# Patient Record
Sex: Female | Born: 1935 | Race: Black or African American | Hispanic: No | Marital: Married
Health system: Southern US, Community
[De-identification: ages and names within clinical notes are randomized; demographics above are authoritative.]

---

## 2013-12-26 DEATH — deceased

## 2021-10-05 ENCOUNTER — Emergency Department (HOSPITAL_COMMUNITY): Payer: Medicare Other

## 2021-10-05 ENCOUNTER — Emergency Department (HOSPITAL_COMMUNITY)
Admission: EM | Admit: 2021-10-05 | Discharge: 2021-10-06 | Disposition: A | Discharge status: Home / Self Care | Payer: Medicare Other | Attending: Emergency Medicine | Admitting: Emergency Medicine

## 2021-10-05 DIAGNOSIS — R001 Bradycardia, unspecified: Secondary | ICD-10-CM | POA: Diagnosis not present

## 2021-10-05 DIAGNOSIS — R55 Syncope and collapse: Secondary | ICD-10-CM | POA: Diagnosis not present

## 2021-10-05 DIAGNOSIS — I10 Essential (primary) hypertension: Secondary | ICD-10-CM | POA: Diagnosis not present

## 2021-10-05 LAB — CBC WITH DIFFERENTIAL/PLATELET
Abs Immature Granulocytes: 0 10*3/uL (ref 0.00–0.07)
Basophils Absolute: 0 10*3/uL (ref 0.0–0.1)
Basophils Relative: 0 %
Eosinophils Absolute: 0.1 10*3/uL (ref 0.0–0.5)
Eosinophils Relative: 2 %
HCT: 38.3 % (ref 36.0–46.0)
Hemoglobin: 12.4 g/dL (ref 12.0–15.0)
Immature Granulocytes: 0 %
Lymphocytes Relative: 23 %
Lymphs Abs: 1 10*3/uL (ref 0.7–4.0)
MCH: 27.7 pg (ref 26.0–34.0)
MCHC: 32.4 g/dL (ref 30.0–36.0)
MCV: 85.7 fL (ref 80.0–100.0)
Monocytes Absolute: 0.3 10*3/uL (ref 0.1–1.0)
Monocytes Relative: 8 %
Neutro Abs: 3.1 10*3/uL (ref 1.7–7.7)
Neutrophils Relative %: 67 %
Platelets: 153 10*3/uL (ref 150–400)
RBC: 4.47 MIL/uL (ref 3.87–5.11)
RDW: 14.9 % (ref 11.5–15.5)
WBC: 4.5 10*3/uL (ref 4.0–10.5)
nRBC: 0 % (ref 0.0–0.2)

## 2021-10-05 MED ORDER — SODIUM CHLORIDE 0.9 % IV BOLUS
500.0000 mL | Freq: Once | INTRAVENOUS | Status: AC
  Administered 2021-10-05: 500 mL via INTRAVENOUS

## 2021-10-05 NOTE — ED Triage Notes (Addendum)
Pt arrived via [facility] for near syncope while eating dinner, pt reported feeling "woozy", no cpp, sob, n/v, diaphoresis. 2nd EMS call in 24 hours for similar cc. EMS report 40 point drop in orthostatics on scene and episodes of bradycardia, lowest 40bpm with frequent PVCs.  GCS 15, A&Ox4.  20g LA, administered approx NS.   Vitals HR 40-60bpm BP 190/90 SPO2 98% CBG 108

## 2021-10-05 NOTE — ED Provider Notes (Signed)
[facility] EMERGENCY DEPARTMENT Provider Note   CSN: [id number] Arrival date & time: 10/05/21  2150     History Chief Complaint  Patient presents with   Near Syncope    Debra Duran is a 85 y.o. female.  The history is provided by the patient and medical records.  Near Syncope  85 y.o. F with hx of HTN, presenting to the ED for near syncope at home tonight while having dinner.  Husband reports history of same in the past related to dehydration.  Husband states she adamantly refuses to drink water during the day-- drinks maybe 1 bottle of water per day.  Last episode of this was in December 2021.  She has recently had her home medications adjusted with cardiology office due to BP/pulse dropping to low.  She denies any active chest pain, SOB.  No recent fever, illness, vomiting, or diarrhea.  Patient with positive orthostatics with EMS (dropped approx 40 points).  Patient from [place], here visiting family.  No past medical history on file.  There are no problems to display for this patient.   OB History   No obstetric history on file.     No family history on file.     Home Medications Prior to Admission medications   Not on File    Allergies    Patient has no allergy information on record.  Review of Systems   Review of Systems  Cardiovascular:  Positive for near-syncope.  All other systems reviewed and are negative.  Physical Exam Updated Vital Signs BP (!) 167/84   Pulse (!) 53   Temp 98.9 F (37.2 C) (Oral)   Resp 15   Ht 5\' 6"  (1.676 m)   Wt 61.2 kg   SpO2 100%   BMI 21.79 kg/m   Physical Exam Vitals and nursing note reviewed.  Constitutional:      Appearance: She is well-developed.  HENT:     Head: Normocephalic and atraumatic.     Mouth/Throat:     Comments: Dry mucous membranes Eyes:     Conjunctiva/sclera: Conjunctivae normal.     Pupils: Pupils are equal, round, and reactive to light.  Cardiovascular:      Rate and Rhythm: Normal rate and regular rhythm.     Heart sounds: Normal heart sounds.  Pulmonary:     Effort: Pulmonary effort is normal. No respiratory distress.     Breath sounds: Normal breath sounds. No rhonchi.  Abdominal:     General: Bowel sounds are normal.     Palpations: Abdomen is soft.     Tenderness: There is no abdominal tenderness. There is no rebound.  Musculoskeletal:        General: Normal range of motion.     Cervical back: Normal range of motion.  Skin:    General: Skin is warm and dry.  Neurological:     Mental Status: She is alert and oriented to person, place, and time.     Comments: Awake, alert, oriented, moving extremities well without focal deficits    ED Results / Procedures / Treatments   Labs (all labs ordered are listed, but only abnormal results are displayed) Labs Reviewed  BASIC METABOLIC PANEL - Abnormal; Notable for the following components:      Result Value   Potassium 3.3 (*)    Glucose, Bld 101 (*)    Creatinine, Ser 1.48 (*)    GFR, Estimated 35 (*)    All other components within normal limits  URINALYSIS, ROUTINE W REFLEX MICROSCOPIC - Abnormal; Notable for the following components:   Color, Urine STRAW (*)    Hgb urine dipstick MODERATE (*)    RBC / HPF >50 (*)    All other components within normal limits  CBC WITH DIFFERENTIAL/PLATELET  TROPONIN I (HIGH SENSITIVITY)    EKG EKG Interpretation  Date/Time:  Tuesday October 05 2021 21:56:35 EDT Ventricular Rate:  54 PR Interval:  204 QRS Duration: 127 QT Interval:  435 QTC Calculation: 413 R Axis:   -6 Text Interpretation: Sinus bradycardia Left ventricular hypertrophy Inferior infarct, old No old tracing to compare Confirmed by Pricilla Loveless [phone]) on 10/05/2021 10:02:23 PM  Radiology DG Chest 2 View  Result Date: 10/05/2021 CLINICAL DATA:  Syncope,  orthostasis EXAM: CHEST - 2 VIEW COMPARISON:  None. FINDINGS: The heart and mediastinal contours are within normal  limits. Aortic calcification. Cardiac surgical changes overlie the mediastinum. No focal consolidation. No pulmonary edema. No pleural effusion. No pneumothorax. No acute osseous abnormality.  Intact sternotomy wires. IMPRESSION: No active cardiopulmonary disease. Electronically Signed   By: Tish Frederickson M.D.   On: 10/05/2021 22:50    Procedures Procedures   Medications Ordered in ED Medications  sodium chloride 0.9 % bolus 500 mL (0 mLs Intravenous Stopped 10/06/21 0010)    ED Course  I have reviewed the triage vital signs and the nursing notes.  Pertinent labs & imaging results that were available during my care of the patient were reviewed by me and considered in my medical decision making (see chart for details).    MDM Rules/Calculators/A&P                           85 y.o. F here with near syncopal event.  There was no head injury or LOC.  Hx of same related to poor oral intake.  Medications also recently adjusted by her cardioligist in [place] due to bradycardia and hypotension.  Was found to be orthostatic with EMS.  Here she is AAOx3.  Openly admits she does not drink water but has been eating regularly.  No recent illness, fever, urinary symptoms.  EKG non-ischemic.  Labs reassuring.  CXR clear.  Patient states she feels better after IVF and wants to go home.  Feel this is reasonable as she has a history of same with explainable cause and appropriate follow-up once she returns home.  Given copies of labs and imaging studies for physician review.  Encouraged to push oral times.  Return here for new concerns.  Final Clinical Impression(s) / ED Diagnoses Final diagnoses:  Near syncope    Rx / DC Orders ED Discharge Orders     None        Garlon Hatchet, PA-C 10/06/21 [id number]    Pricilla Loveless, MD 10/08/21 1630

## 2021-10-06 LAB — URINALYSIS, ROUTINE W REFLEX MICROSCOPIC
Bacteria, UA: NONE SEEN
Bilirubin Urine: NEGATIVE
Glucose, UA: NEGATIVE mg/dL
Ketones, ur: NEGATIVE mg/dL
Leukocytes,Ua: NEGATIVE
Nitrite: NEGATIVE
Protein, ur: NEGATIVE mg/dL
RBC / HPF: >50 RBC/hpf — ABNORMAL HIGH (ref 0–5)
Specific Gravity, Urine: 1.009 (ref 1.005–1.030)
pH: 7 (ref 5.0–8.0)

## 2021-10-06 LAB — BASIC METABOLIC PANEL
Anion gap: 12 (ref 5–15)
BUN: 21 mg/dL (ref 8–23)
CO2: 28 mmol/L (ref 22–32)
Calcium: 9.4 mg/dL (ref 8.9–10.3)
Chloride: 98 mmol/L (ref 98–111)
Creatinine, Ser: 1.48 mg/dL — ABNORMAL HIGH (ref 0.44–1.00)
GFR, Estimated: 35 mL/min — ABNORMAL LOW (ref >60)
Glucose, Bld: 101 mg/dL — ABNORMAL HIGH (ref 70–99)
Potassium: 3.3 mmol/L — ABNORMAL LOW (ref 3.5–5.1)
Sodium: 138 mmol/L (ref 135–145)

## 2021-10-06 LAB — TROPONIN I (HIGH SENSITIVITY): Troponin I (High Sensitivity): 6 ng/L (ref <18)

## 2021-10-06 NOTE — Discharge Instructions (Signed)
Please increase your water intake. Follow-up with your doctor once you get home.  Copies of labs and imaging on back. Return here for new concerns.

## 2021-12-18 ENCOUNTER — Inpatient Hospital Stay (HOSPITAL_COMMUNITY)
Admission: EM | Admit: 2021-12-18 | Discharge: 2021-12-20 | DRG: 312 | Disposition: A | Discharge status: Home / Self Care | Payer: Medicare Other | Attending: Internal Medicine | Admitting: Internal Medicine

## 2021-12-18 ENCOUNTER — Emergency Department (HOSPITAL_COMMUNITY): Payer: Medicare Other

## 2021-12-18 ENCOUNTER — Other Ambulatory Visit: Payer: Self-pay

## 2021-12-18 DIAGNOSIS — I10 Essential (primary) hypertension: Secondary | ICD-10-CM

## 2021-12-18 DIAGNOSIS — Z7902 Long term (current) use of antithrombotics/antiplatelets: Secondary | ICD-10-CM

## 2021-12-18 DIAGNOSIS — R55 Syncope and collapse: Secondary | ICD-10-CM | POA: Diagnosis not present

## 2021-12-18 DIAGNOSIS — I951 Orthostatic hypotension: Secondary | ICD-10-CM | POA: Diagnosis not present

## 2021-12-18 DIAGNOSIS — Z955 Presence of coronary angioplasty implant and graft: Secondary | ICD-10-CM

## 2021-12-18 DIAGNOSIS — Z880 Allergy status to penicillin: Secondary | ICD-10-CM

## 2021-12-18 DIAGNOSIS — Z951 Presence of aortocoronary bypass graft: Secondary | ICD-10-CM

## 2021-12-18 DIAGNOSIS — R001 Bradycardia, unspecified: Secondary | ICD-10-CM | POA: Diagnosis present

## 2021-12-18 DIAGNOSIS — E876 Hypokalemia: Secondary | ICD-10-CM | POA: Diagnosis present

## 2021-12-18 DIAGNOSIS — Z886 Allergy status to analgesic agent status: Secondary | ICD-10-CM

## 2021-12-18 DIAGNOSIS — Z20822 Contact with and (suspected) exposure to covid-19: Secondary | ICD-10-CM | POA: Diagnosis present

## 2021-12-18 DIAGNOSIS — E785 Hyperlipidemia, unspecified: Secondary | ICD-10-CM | POA: Diagnosis present

## 2021-12-18 DIAGNOSIS — I2510 Atherosclerotic heart disease of native coronary artery without angina pectoris: Secondary | ICD-10-CM

## 2021-12-18 LAB — TROPONIN I (HIGH SENSITIVITY): Troponin I (High Sensitivity): 5 ng/L (ref <18)

## 2021-12-18 LAB — CBC WITH DIFFERENTIAL/PLATELET
Abs Immature Granulocytes: 0.02 10*3/uL (ref 0.00–0.07)
Basophils Absolute: 0 10*3/uL (ref 0.0–0.1)
Basophils Relative: 0 %
Eosinophils Absolute: 0.1 10*3/uL (ref 0.0–0.5)
Eosinophils Relative: 1 %
HCT: 39.8 % (ref 36.0–46.0)
Hemoglobin: 13 g/dL (ref 12.0–15.0)
Immature Granulocytes: 0 %
Lymphocytes Relative: 21 %
Lymphs Abs: 1 10*3/uL (ref 0.7–4.0)
MCH: 27.5 pg (ref 26.0–34.0)
MCHC: 32.7 g/dL (ref 30.0–36.0)
MCV: 84.3 fL (ref 80.0–100.0)
Monocytes Absolute: 0.3 10*3/uL (ref 0.1–1.0)
Monocytes Relative: 7 %
Neutro Abs: 3.4 10*3/uL (ref 1.7–7.7)
Neutrophils Relative %: 71 %
Platelets: 182 10*3/uL (ref 150–400)
RBC: 4.72 MIL/uL (ref 3.87–5.11)
RDW: 15.5 % (ref 11.5–15.5)
WBC: 4.8 10*3/uL (ref 4.0–10.5)
nRBC: 0 % (ref 0.0–0.2)

## 2021-12-18 LAB — BASIC METABOLIC PANEL
Anion gap: 8 (ref 5–15)
BUN: 11 mg/dL (ref 8–23)
CO2: 31 mmol/L (ref 22–32)
Calcium: 9.5 mg/dL (ref 8.9–10.3)
Chloride: 97 mmol/L — ABNORMAL LOW (ref 98–111)
Creatinine, Ser: 1.3 mg/dL — ABNORMAL HIGH (ref 0.44–1.00)
GFR, Estimated: 41 mL/min — ABNORMAL LOW (ref >60)
Glucose, Bld: 135 mg/dL — ABNORMAL HIGH (ref 70–99)
Potassium: 3.3 mmol/L — ABNORMAL LOW (ref 3.5–5.1)
Sodium: 136 mmol/L (ref 135–145)

## 2021-12-18 MED ORDER — LACTATED RINGERS IV BOLUS
1000.0000 mL | Freq: Once | INTRAVENOUS | Status: AC
  Administered 2021-12-19: 01:00:00 1000 mL via INTRAVENOUS

## 2021-12-18 NOTE — ED Provider Notes (Signed)
Emergency Medicine Provider Triage Evaluation Note  Debra Duran , a 85 y.o. female  was evaluated in triage.  Pt complains of syncope.  Patient states that she was sitting at the dinner table tonight when she began to feel "woozy."  She states that her husband came around the table and helped her to the ground so she did not fall or hit her head.  Unknown whether she had full loss of consciousness as husband is not here to cooperate at this time.  Per EMS the patient had twelve-lead with questionable inferior ST depression.  Also noted the patient to be bradycardic.  The patient denies chest pain, lightheadedness or dizziness, palpitations, shortness of breath, lower extremity swelling, abdominal pain, nausea or diaphoresis at this time.  She denies any other symptoms during the syncopal episode.  She does have history of very similar presentation in October and was seen here in the emergency department.  Review of Systems  Positive: See above Negative:   Physical Exam  BP (!) 165/91 (BP Location: Left Arm)    Pulse 66    Temp 98.3 F (36.8 C) (Oral)    Resp 15    Ht 5\' 6"  (1.676 m)    Wt 61.2 kg    SpO2 97%    BMI 21.79 kg/m  Gen:   Awake, no distress   Resp:  Normal effort, lungs clear MSK:   Moves extremities without difficulty  Other:  S1/S2 without murmur.  Medical Decision Making  Medically screening exam initiated at 9:27 PM.  Appropriate orders placed.  Debra Duran was informed that the remainder of the evaluation will be completed by another provider, this initial triage assessment does not replace that evaluation, and the importance of remaining in the ED until their evaluation is complete.     Berniece Salines, PA-C 12/18/21 2130    2131, MD 12/19/21 [phone]

## 2021-12-18 NOTE — ED Provider Notes (Signed)
[facility] EMERGENCY DEPARTMENT Provider Note   CSN: [id number] Arrival date & time: 12/18/21  2101     History Chief Complaint  Patient presents with   Loss of Consciousness    Debra Duran is a 85 y.o. female.  Patient here by EMS with episode of syncope.  States she was sitting at the dinner table eating dinner when all of a sudden her family noticed that she was slumped over in her chair and had decreased responsiveness.  She was lowered to the ground by her family but did not fall or hit her head.  They states she was awake and responsive the whole time but somewhat slow to respond.  Patient does recall being moved to the floor by her family.  States while eating dinner she began to feel "woozy" but then later states that she did not know that this episode was coming.  She does have a history of previous episodes of syncope with work-up in [place] including echocardiogram by husband's report.  He states she has had frequent episodes of syncope in the past due to dehydration but he is placed emphasis on her drinking water and does not think that she is dehydrated currently.  She took her blood pressure medication is normal. She denies any room spinning dizziness.  She denies any chest pain or shortness of breath.  No abdominal pain.  No vomiting or diarrhea.  Denies hitting head. She does take plavix. EMS reported she has ST depressions inferiorly as well as bradycardia in the 50s.  She denies feeling dizzy currently. Patient had a similar episode in October at this facility that was attributed to dehydration  The history is provided by the patient and the spouse.  Loss of Consciousness Associated symptoms: dizziness   Associated symptoms: no chest pain, no fever, no headaches, no nausea, no shortness of breath and no vomiting       No past medical history on file.  There are no problems to display for this patient.   * The histories are not reviewed yet.  Please review them in the "History" navigator section and refresh this [place].   OB History   No obstetric history on file.     No family history on file.     Home Medications Prior to Admission medications   Not on File    Allergies    Aspirin and Penicillins  Review of Systems   Review of Systems  Constitutional:  Negative for activity change, appetite change and fever.  HENT:  Negative for congestion and rhinorrhea.   Eyes:  Negative for visual disturbance.  Respiratory:  Negative for cough, chest tightness and shortness of breath.   Cardiovascular:  Positive for syncope. Negative for chest pain and leg swelling.  Gastrointestinal:  Negative for abdominal pain, nausea and vomiting.  Genitourinary:  Negative for dysuria and hematuria.  Musculoskeletal:  Negative for arthralgias and myalgias.  Neurological:  Positive for dizziness, syncope and light-headedness. Negative for headaches.   all other systems are negative except as noted in the HPI and PMH.   Physical Exam Updated Vital Signs BP (!) 194/91    Pulse 60    Temp 98.3 F (36.8 C) (Oral)    Resp 18    Ht 5\' 6"  (1.676 m)    Wt 61.2 kg    SpO2 96%    BMI 21.79 kg/m   Physical Exam Vitals and nursing note reviewed.  Constitutional:      General: She  is not in acute distress.    Appearance: She is well-developed.  HENT:     Head: Normocephalic and atraumatic.     Mouth/Throat:     Pharynx: No oropharyngeal exudate.  Eyes:     Conjunctiva/sclera: Conjunctivae normal.     Pupils: Pupils are equal, round, and reactive to light.     Comments: Conjunctival hemorrhage on L  Neck:     Comments: No meningismus. Cardiovascular:     Rate and Rhythm: Normal rate and regular rhythm.     Heart sounds: Murmur heard.     Comments: Systolic murmur.  irregular PVCs on monitor Pulmonary:     Effort: Pulmonary effort is normal. No respiratory distress.     Breath sounds: Normal breath sounds.  Chest:     Chest wall:  No tenderness.  Abdominal:     Palpations: Abdomen is soft.     Tenderness: There is no abdominal tenderness. There is no guarding or rebound.  Musculoskeletal:        General: No tenderness. Normal range of motion.     Cervical back: Normal range of motion and neck supple.  Skin:    General: Skin is warm.  Neurological:     General: No focal deficit present.     Mental Status: She is alert and oriented to person, place, and time. Mental status is at baseline.     Cranial Nerves: No cranial nerve deficit.     Motor: No abnormal muscle tone.     Coordination: Coordination normal.     Comments: CN 2-12 intact, no ataxia on finger to nose, no nystagmus, 5/5 strength throughout, no pronator drift,    Psychiatric:        Behavior: Behavior normal.    ED Results / Procedures / Treatments   Labs (all labs ordered are listed, but only abnormal results are displayed) Labs Reviewed  BASIC METABOLIC PANEL - Abnormal; Notable for the following components:      Result Value   Potassium 3.3 (*)    Chloride 97 (*)    Glucose, Bld 135 (*)    Creatinine, Ser 1.30 (*)    GFR, Estimated 41 (*)    All other components within normal limits  RESP PANEL BY RT-PCR (FLU A&B, COVID) ARPGX2  CBC WITH DIFFERENTIAL/PLATELET  URINALYSIS, ROUTINE W REFLEX MICROSCOPIC  CBG MONITORING, ED  TROPONIN I (HIGH SENSITIVITY)  TROPONIN I (HIGH SENSITIVITY)    EKG EKG Interpretation  Date/Time:  Saturday December 18 2021 21:18:58 EST Ventricular Rate:  67 PR Interval:  188 QRS Duration: 110 QT Interval:  406 QTC Calculation: 429 R Axis:   -18 Text Interpretation: Normal sinus rhythm Moderate voltage criteria for LVH, may be normal variant ( R in aVL , Cornell product ) Inferior infarct , age undetermined Cannot rule out Anterior infarct , age undetermined Abnormal ECG No significant change was found Interpretation limited secondary to artifact Confirmed by Glynn Octave [phone]) on 12/18/2021 10:56:08  PM  Radiology DG Chest 2 View  Result Date: 12/18/2021 CLINICAL DATA:  Syncope EXAM: CHEST - 2 VIEW COMPARISON:  10/05/2021 FINDINGS: Post sternotomy changes. No focal opacity, pleural effusion or pneumothorax. Stable cardiomediastinal silhouette with aortic atherosclerosis. IMPRESSION: No active cardiopulmonary disease. Electronically Signed   By: Jasmine Pang M.D.   On: 12/18/2021 21:47   CT Head Wo Contrast  Result Date: 12/19/2021 CLINICAL DATA:  Syncope. EXAM: CT HEAD WITHOUT CONTRAST TECHNIQUE: Contiguous axial images were obtained from the base of the skull  through the vertex without intravenous contrast. COMPARISON:  None. FINDINGS: Brain: No acute intracranial hemorrhage, midline shift or mass effect. No extra-axial fluid collection. There is diffuse cerebral atrophy. Subcortical and periventricular white matter hypodensities are present bilaterally. No hydrocephalus. 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 There is a hypodensity in the basal ganglia on the left, possible lacunar infarct of indeterminate age. Vascular: Atherosclerotic calcification of the carotid siphons and vertebral arteries. No hyperdense vessel. Skull: Normal. Negative for fracture or focal lesion. Sinuses/Orbits: A few opacities are present in the ethmoid air cells on the right. The orbits are within normal limits. Other: None. IMPRESSION: 1. No acute intracranial hemorrhage. 2. Hypodense focus in the basal ganglia on the left, possible lacunar infarct of indeterminate age. 3. 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 Comparison with older imaging studies or MRI is recommended for further characterization on follow-up. 4. Atrophy with chronic microvascular ischemic changes. Electronically Signed   By: Brett Fairy M.D.   On: 12/19/2021 00:00    Procedures Procedures   Medications Ordered in ED Medications  lactated ringers  bolus 1,000 mL (has no administration in time range)    ED Course  I have reviewed the triage vital signs and the nursing notes.  Pertinent labs & imaging results that were available during my care of the patient were reviewed by me and considered in my medical decision making (see chart for details).    MDM Rules/Calculators/A&P                         Episode of syncope with history of the same.  Husband concerned that this is not due to dehydration as they have placed emphasis on her hydrating currently.  EKG is unchanged.  No prolonged QT, no Brugada  Orthostatics are positive.  Patient drops from [id number] systolic to [id number] systolic with standing.  Denies feeling dizzy with this.  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  Labs are reassuring with negative troponin and electrolytes.  Suspect likely orthostasis and possible vasovagal syncope but with her lack of prodrome and heart murmur with frequent PVCs there is concern for cardiogenic etiology of her syncope.  CT scan results discussed with patient and husband at bedside and need for MRI.  They are agreeable to observation admission.  Discussed with Dr. Marlowe Sax.     Final Clinical Impression(s) / ED Diagnoses Final diagnoses:  None    Rx / DC Orders ED Discharge Orders     None        Deyna Carbon, Annie Main, MD 12/19/21 [phone]

## 2021-12-18 NOTE — ED Notes (Signed)
Pt return from CT.

## 2021-12-18 NOTE — ED Triage Notes (Signed)
Pt brought to ED triage via wheelchair by [facility] with c/o syncopal episode while standing up from dinner table this evening. Per EMS, pt has extensive cardiac history and 12 lead ECG showed inferior ST depression. Pt also noted to be bradycardic, has recently had changes in cardiac medication for the same. Denies any CP at time of triage.

## 2021-12-18 NOTE — ED Notes (Signed)
Patient transported to CT 

## 2021-12-19 ENCOUNTER — Observation Stay (HOSPITAL_COMMUNITY): Payer: Medicare Other

## 2021-12-19 ENCOUNTER — Encounter (HOSPITAL_COMMUNITY): Payer: Self-pay | Admitting: Internal Medicine

## 2021-12-19 DIAGNOSIS — I951 Orthostatic hypotension: Secondary | ICD-10-CM | POA: Diagnosis present

## 2021-12-19 DIAGNOSIS — R55 Syncope and collapse: Secondary | ICD-10-CM | POA: Diagnosis present

## 2021-12-19 DIAGNOSIS — Z20822 Contact with and (suspected) exposure to covid-19: Secondary | ICD-10-CM | POA: Diagnosis present

## 2021-12-19 DIAGNOSIS — I10 Essential (primary) hypertension: Secondary | ICD-10-CM | POA: Diagnosis present

## 2021-12-19 DIAGNOSIS — I2510 Atherosclerotic heart disease of native coronary artery without angina pectoris: Secondary | ICD-10-CM | POA: Diagnosis present

## 2021-12-19 DIAGNOSIS — E876 Hypokalemia: Secondary | ICD-10-CM | POA: Diagnosis present

## 2021-12-19 DIAGNOSIS — E785 Hyperlipidemia, unspecified: Secondary | ICD-10-CM | POA: Diagnosis present

## 2021-12-19 DIAGNOSIS — Z955 Presence of coronary angioplasty implant and graft: Secondary | ICD-10-CM | POA: Diagnosis not present

## 2021-12-19 DIAGNOSIS — Z886 Allergy status to analgesic agent status: Secondary | ICD-10-CM | POA: Diagnosis not present

## 2021-12-19 DIAGNOSIS — R001 Bradycardia, unspecified: Secondary | ICD-10-CM | POA: Diagnosis present

## 2021-12-19 DIAGNOSIS — Z880 Allergy status to penicillin: Secondary | ICD-10-CM | POA: Diagnosis not present

## 2021-12-19 DIAGNOSIS — Z951 Presence of aortocoronary bypass graft: Secondary | ICD-10-CM | POA: Diagnosis not present

## 2021-12-19 DIAGNOSIS — Z7902 Long term (current) use of antithrombotics/antiplatelets: Secondary | ICD-10-CM | POA: Diagnosis not present

## 2021-12-19 LAB — ECHOCARDIOGRAM COMPLETE
Area-P 1/2: 3.21 cm2
Height: 66 in
S' Lateral: 3 cm
Weight: 2160 oz

## 2021-12-19 LAB — TROPONIN I (HIGH SENSITIVITY): Troponin I (High Sensitivity): 6 ng/L (ref <18)

## 2021-12-19 LAB — URINALYSIS, ROUTINE W REFLEX MICROSCOPIC
Bilirubin Urine: NEGATIVE
Glucose, UA: NEGATIVE mg/dL
Hgb urine dipstick: NEGATIVE
Ketones, ur: NEGATIVE mg/dL
Leukocytes,Ua: NEGATIVE
Nitrite: NEGATIVE
Protein, ur: NEGATIVE mg/dL
Specific Gravity, Urine: 1.01 (ref 1.005–1.030)
pH: 7 (ref 5.0–8.0)

## 2021-12-19 LAB — BASIC METABOLIC PANEL
Anion gap: 7 (ref 5–15)
BUN: 10 mg/dL (ref 8–23)
CO2: 32 mmol/L (ref 22–32)
Calcium: 9.7 mg/dL (ref 8.9–10.3)
Chloride: 102 mmol/L (ref 98–111)
Creatinine, Ser: 1.15 mg/dL — ABNORMAL HIGH (ref 0.44–1.00)
GFR, Estimated: 47 mL/min — ABNORMAL LOW (ref >60)
Glucose, Bld: 103 mg/dL — ABNORMAL HIGH (ref 70–99)
Potassium: 3.4 mmol/L — ABNORMAL LOW (ref 3.5–5.1)
Sodium: 141 mmol/L (ref 135–145)

## 2021-12-19 LAB — RESP PANEL BY RT-PCR (FLU A&B, COVID) ARPGX2
Influenza A by PCR: NEGATIVE
Influenza B by PCR: NEGATIVE
SARS Coronavirus 2 by RT PCR: NEGATIVE

## 2021-12-19 MED ORDER — POTASSIUM CHLORIDE CRYS ER 20 MEQ PO TBCR
40.0000 meq | EXTENDED_RELEASE_TABLET | Freq: Once | ORAL | Status: AC
  Administered 2021-12-19: 14:00:00 40 meq via ORAL
  Filled 2021-12-19: qty 2

## 2021-12-19 MED ORDER — ISOSORBIDE DINITRATE 20 MG PO TABS
20.0000 mg | ORAL_TABLET | Freq: Two times a day (BID) | ORAL | Status: DC
  Administered 2021-12-19 – 2021-12-20 (×3): 20 mg via ORAL
  Filled 2021-12-19 (×4): qty 1

## 2021-12-19 MED ORDER — ROSUVASTATIN CALCIUM 20 MG PO TABS
20.0000 mg | ORAL_TABLET | ORAL | Status: DC
  Administered 2021-12-20: 10:00:00 20 mg via ORAL
  Filled 2021-12-19: qty 1

## 2021-12-19 MED ORDER — CLOPIDOGREL BISULFATE 75 MG PO TABS
75.0000 mg | ORAL_TABLET | Freq: Every evening | ORAL | Status: DC
  Administered 2021-12-19: 18:00:00 75 mg via ORAL
  Filled 2021-12-19: qty 1

## 2021-12-19 MED ORDER — HYDRALAZINE HCL 20 MG/ML IJ SOLN: 10.0000 mg | Freq: Four times a day (QID) | INTRAMUSCULAR | Status: DC | PRN

## 2021-12-19 MED ORDER — AMLODIPINE BESYLATE 5 MG PO TABS: 5.0000 mg | ORAL_TABLET | ORAL | Status: DC

## 2021-12-19 MED ORDER — ENOXAPARIN SODIUM 40 MG/0.4ML IJ SOSY
40.0000 mg | PREFILLED_SYRINGE | INTRAMUSCULAR | Status: DC
  Administered 2021-12-19 – 2021-12-20 (×2): 40 mg via SUBCUTANEOUS
  Filled 2021-12-19 (×2): qty 0.4

## 2021-12-19 MED ORDER — DONEPEZIL HCL 10 MG PO TABS
10.0000 mg | ORAL_TABLET | Freq: Every day | ORAL | Status: DC
  Administered 2021-12-19: 22:00:00 10 mg via ORAL
  Filled 2021-12-19: qty 1

## 2021-12-19 MED ORDER — LABETALOL HCL 5 MG/ML IV SOLN: 10.0000 mg | INTRAVENOUS | Status: DC | PRN

## 2021-12-19 MED ORDER — SODIUM CHLORIDE 0.9 % IV SOLN: INTRAVENOUS | Status: DC

## 2021-12-19 MED ORDER — AMLODIPINE BESYLATE 10 MG PO TABS: 10.0000 mg | ORAL_TABLET | ORAL | Status: DC

## 2021-12-19 MED ORDER — EZETIMIBE 10 MG PO TABS
10.0000 mg | ORAL_TABLET | Freq: Every evening | ORAL | Status: DC
  Administered 2021-12-19: 18:00:00 10 mg via ORAL
  Filled 2021-12-19: qty 1

## 2021-12-19 NOTE — ED Notes (Signed)
ED Provider at bedside. 

## 2021-12-19 NOTE — ED Notes (Signed)
Pt assisted to restroom.  

## 2021-12-19 NOTE — Progress Notes (Signed)
*  PRELIMINARY RESULTS* Echocardiogram 2D Echocardiogram has been performed.  Stacey Drain 12/19/2021, 2:28 PM

## 2021-12-19 NOTE — H&P (Signed)
History and Physical    Debra Duran RDE:[id number] DOB: Feb 11, 1936 DOA: 12/18/2021  PCP: Pcp, No   Patient coming from: Home  Chief Complaint: Syncope  HPI: Debra Duran is a 85 y.o. female with medical history significant of coronary artery disease status post CABG, stent placement, hypertension who presented from home after she had an episode of syncope.  Patient lives in [place].  She was visiting her daughter for the Christmas.  Yesterday she was sitting at the dinner table eating dinner with her family, she suddenly slumped over in the chair and passed out.  She did not fall or hit her head.  As per the family member, she was not totally unresponsive during the episode, did not have any confusion after the episode and no evidence of jerky movements of the body.  She had an episode like this in the past when she visited her daughter.  Patient has history of coronary artery  disease, follows with cardiology and PCP at [place].  On presentation she was hypertensive.  EKG showed ST depression in the inferior leads.  She denies any chest pain, shortness of breath, cough, headache, fever, chills, abdominal pain, dysuria, nausea or vomiting.  EKG showed mild sinus bradycardia.  Patient was noted to be orthostatic on presentation.  She denied dizziness on presentation.   ED Course: Blood pressure was on the higher range on arrival.  Lab work showed potassium of 3.3.  Creatinine of 1.3.  Chest x-ray did not show any acute intrathoracic abnormality.  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  Patient was admitted for syncopal work-up.  Orthostatic vitals were positive  Review of Systems: As per HPI otherwise 10 point review of systems negative.    History reviewed. No pertinent past medical history.  History reviewed. No pertinent surgical  history.   reports that she has an unknown smoking status. She has never used smokeless tobacco. No history on file for alcohol use and drug use.  Allergies  Allergen Reactions   Aspirin Hives   Penicillins Hives    History reviewed. No pertinent family history.   Prior to Admission medications   Medication Sig Start Date End Date Taking? Authorizing Provider  amLODipine (NORVASC) 2.5 MG tablet Take 2.5-5 mg by mouth See admin instructions. 2.5 mg in the afternoon 5 mg in the evening 10/09/21  Yes [provider]  Cholecalciferol (VITAMIN D-3) 125 MCG (5000 UT) TABS Take 5,000 Units by mouth daily. afternoon   Yes [provider]  clopidogrel (PLAVIX) 75 MG tablet Take 75 mg by mouth every evening. 11/30/21  Yes [provider]  Coenzyme Q10 (COQ-10) 100 MG CAPS Take 100 mg by mouth daily. afternoon   Yes [provider]  donepezil (ARICEPT) 10 MG tablet Take 10 mg by mouth at bedtime. 12/12/21  Yes [provider]  ezetimibe (ZETIA) 10 MG tablet Take 10 mg by mouth every evening. 12/15/21  Yes [provider]  isosorbide dinitrate (ISORDIL) 20 MG tablet Take 20 mg by mouth 2 (two) times daily. 12/07/21  Yes [provider]  OVER THE COUNTER MEDICATION Take 1 capsule by mouth daily. Tumeric-- afternoon   Yes [provider]  Probiotic Product (PROBIOTIC DAILY PO) Take 1 capsule by mouth daily.   Yes [provider]  rosuvastatin (CRESTOR) 40 MG tablet Take 20 mg by mouth every  other day. 12/12/21  Yes [provider]  traZODone (DESYREL) 50 MG tablet Take 25 mg by mouth at bedtime. 10/23/21  Yes [provider]    Physical Exam: Vitals:   12/19/21 0345 12/19/21 0635 12/19/21 0724 12/19/21 1153  BP: (!) 137/91 (!) 155/92 (!) 183/88 (!) 145/79  Pulse: 90 63    Resp: 17 16    Temp:  98 F (36.7 C)  98.5 F (36.9 C)  TempSrc:  Oral Oral Oral  SpO2: 96% 99%    Weight:      Height:         Constitutional: NAD, calm, comfortable, pleasant elderly female Vitals:   12/19/21 0345 12/19/21 0635 12/19/21 0724 12/19/21 1153  BP: (!) 137/91 (!) 155/92 (!) 183/88 (!) 145/79  Pulse: 90 63    Resp: 17 16    Temp:  98 F (36.7 C)  98.5 F (36.9 C)  TempSrc:  Oral Oral Oral  SpO2: 96% 99%    Weight:      Height:       Eyes: PERRL, lids and conjunctivae normal ENMT: Mucous membranes are moist.  Neck: normal, supple, no masses, no thyromegaly Respiratory: clear to auscultation bilaterally, no wheezing, no crackles. Normal respiratory effort. No accessory muscle use.  Cardiovascular: Regular rate and rhythm, no murmurs / rubs / gallops. No extremity edema.  Abdomen: no tenderness, no masses palpated. No hepatosplenomegaly. Bowel sounds positive.  Musculoskeletal: no clubbing / cyanosis. No joint deformity upper and lower extremities.  Skin: no rashes, lesions, ulcers. No induration Neurologic: CN 2-12 grossly intact.  Strength 5/5 in all 4.  Psychiatric: Normal judgment and insight. Alert and oriented x 3. Normal mood.   Foley Catheter:None  Labs on Admission: I have personally reviewed following labs and imaging studies  CBC: Recent Labs  Lab 12/18/21 2131  WBC 4.8  NEUTROABS 3.4  HGB 13.0  HCT 39.8  MCV 84.3  PLT 182   Basic Metabolic Panel: Recent Labs  Lab 12/18/21 2131 12/19/21 0836  NA 136 141  K 3.3* 3.4*  CL 97* 102  CO2 31 32  GLUCOSE 135* 103*  BUN 11 10  CREATININE 1.30* 1.15*  CALCIUM 9.5 9.7   GFR: Estimated Creatinine Clearance: 34.7 mL/min (A) (by C-G formula based on SCr of 1.15 mg/dL (H)). Liver Function Tests: No results for input(s): AST, ALT, ALKPHOS, BILITOT, PROT, ALBUMIN in the last 168 hours. No results for input(s): LIPASE, AMYLASE in the last 168 hours. No results for input(s): AMMONIA in the last 168 hours. Coagulation Profile: No results for input(s): INR, PROTIME in the last 168 hours. Cardiac Enzymes: No results for  input(s): CKTOTAL, CKMB, CKMBINDEX, TROPONINI in the last 168 hours. BNP (last 3 results) No results for input(s): PROBNP in the last 8760 hours. HbA1C: No results for input(s): HGBA1C in the last 72 hours. CBG: No results for input(s): GLUCAP in the last 168 hours. Lipid Profile: No results for input(s): CHOL, HDL, LDLCALC, TRIG, CHOLHDL, LDLDIRECT in the last 72 hours. Thyroid Function Tests: No results for input(s): TSH, T4TOTAL, FREET4, T3FREE, THYROIDAB in the last 72 hours. Anemia Panel: No results for input(s): VITAMINB12, FOLATE, FERRITIN, TIBC, IRON, RETICCTPCT in the last 72 hours. Urine analysis:    Component Value Date/Time   COLORURINE YELLOW 12/18/2021 2337   APPEARANCEUR CLEAR 12/18/2021 2337   LABSPEC 1.010 12/18/2021 2337   PHURINE 7.0 12/18/2021 2337   GLUCOSEU NEGATIVE 12/18/2021 2337   HGBUR NEGATIVE 12/18/2021 2337   BILIRUBINUR NEGATIVE  12/18/2021 2337   KETONESUR NEGATIVE 12/18/2021 2337   PROTEINUR NEGATIVE 12/18/2021 2337   NITRITE NEGATIVE 12/18/2021 2337   LEUKOCYTESUR NEGATIVE 12/18/2021 2337    Radiological Exams on Admission: DG Chest 2 View  Result Date: 12/18/2021 CLINICAL DATA:  Syncope EXAM: CHEST - 2 VIEW COMPARISON:  10/05/2021 FINDINGS: Post sternotomy changes. No focal opacity, pleural effusion or pneumothorax. Stable cardiomediastinal silhouette with aortic atherosclerosis. IMPRESSION: No active cardiopulmonary disease. Electronically Signed   By: Jasmine Pang M.D.   On: 12/18/2021 21:47   CT Head Wo Contrast  Result Date: 12/19/2021 CLINICAL DATA:  Syncope. EXAM: CT HEAD WITHOUT CONTRAST TECHNIQUE: Contiguous axial images were obtained from the base of the skull through the vertex without intravenous contrast. COMPARISON:  None. FINDINGS: Brain: No acute intracranial hemorrhage, midline shift or mass effect. No extra-axial fluid collection. There is diffuse cerebral atrophy. Subcortical and periventricular white matter hypodensities are  present bilaterally. No hydrocephalus. 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 There is a hypodensity in the basal ganglia on the left, possible lacunar infarct of indeterminate age. Vascular: Atherosclerotic calcification of the carotid siphons and vertebral arteries. No hyperdense vessel. Skull: Normal. Negative for fracture or focal lesion. Sinuses/Orbits: A few opacities are present in the ethmoid air cells on the right. The orbits are within normal limits. Other: None. IMPRESSION: 1. No acute intracranial hemorrhage. 2. Hypodense focus in the basal ganglia on the left, possible lacunar infarct of indeterminate age. 3. 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 Comparison with older imaging studies or MRI is recommended for further characterization on follow-up. 4. Atrophy with chronic microvascular ischemic changes. Electronically Signed   By: Thornell Sartorius M.D.   On: 12/19/2021 00:00     Assessment/Plan Principal Problem:   Syncope Active Problems:   HTN (hypertension)   CAD (coronary artery disease)   Syncope: This is most likely secondary to orthostatic hypotension.  Orthostatic vitals were positive on presentation.  Repeat orthostatic vitals done by PT/OT were positive today. Patient had a similar event in the past. Continue IV fluids.  PT/OT did not recommend any follow-up on discharge We are checking echocardiogram.  EKG showed T wave inversions in the lateral leads most likely chronic findings.  Denies chest pain. Low suspicion for seizure episode, no history of seizure, patient was not postictal after the event. As per the husband, he has observed  syncopal episode mainly during the dinnertime in the past as well.    Hypertension: Consistently hypertensive here.  Takes Imdur, amlodipine at home, restarted those.  Continue as needed medications for severe hypertension  Hypokalemia: Supplemented  with potassium  HLD: On crestor  History of coronary artery disease;Status post CABG, status post stent placement.  Takes Plavix at home.  Follows with cardiology at [facility]    Severity of Illness: The appropriate patient status for this patient is OBSERVATION.   DVT prophylaxis: Lovenox Code Status: Full Family Communication: Called and discussed with daughter/husband  on phone today Consults called: None     Burnadette Pop MD [facility]  12/19/2021, 12:28 PM

## 2021-12-19 NOTE — Evaluation (Signed)
Physical Therapy Evaluation & Discharge Patient Details Name: Debra Duran MRN: [id number] DOB: 14-May-1936 Today's Date: 12/19/2021  History of Present Illness  Pt is an 85 y.o. female admitted on 12/18/21 due to a syncopal episode standing up from dinner table; positive orthostatic hypotension, although pt asymptomatic. Head CT shows age-indeterminate lacunar infarct and possible colloid cyst. PMH not on file.   Clinical Impression  Patient evaluated by Physical Therapy with no further acute PT needs identified. PTA, pt mod indep with SPC, lives with husband in [place], currently in town visiting daughter (reports similar episode occurred last time she was in town visiting). Today, pt mod indep with SPC for mobility and ADLs, no overt instability or LOB. Pt with (+) orthostatic hypotension, but denies symptoms. All education has been completed and the patient has no further questions. Acute PT is signing off. Thank you for this referral.    Orthostatic BPs Sitting EOB (resting) 160/92  Sitting post-ambulation to bathroom 145/90  Standing 117/70  Standing after 2 min 113/79       Recommendations for follow up therapy are one component of a multi-disciplinary discharge planning process, led by the attending physician.  Recommendations may be updated based on patient status, additional functional criteria and insurance authorization.  Follow Up Recommendations No PT follow up    Assistance Recommended at Discharge PRN  Functional Status Assessment Patient has not had a recent decline in their functional status  Equipment Recommendations  None recommended by PT    Recommendations for Other Services       Precautions / Restrictions Precautions Precautions: Fall;Other (comment) Precaution Comments: (+) orthostatic hypotension - pt asymptomatic Restrictions Weight Bearing Restrictions: No      Mobility  Bed Mobility Overal bed mobility: Independent                   Transfers Overall transfer level: Modified independent Equipment used: Straight cane               General transfer comment: mod indep standing from EOB and toilet with SPC    Ambulation/Gait Ambulation/Gait assistance: Modified independent (Device/Increase time) Gait Distance (Feet): 32 Feet Assistive device: Straight cane Gait Pattern/deviations: Step-through pattern;Decreased stride length;Trunk flexed Gait velocity: Decreased     General Gait Details: Slow, steady gait with and without SPC; pt denies dizziness  Stairs            Wheelchair Mobility    Modified Rankin (Stroke Patients Only)       Balance Overall balance assessment: No apparent balance deficits (not formally assessed)                                           Pertinent Vitals/Pain Pain Assessment: No/denies pain    Home Living Family/patient expects to be discharged to:: Private residence Living Arrangements: Spouse/significant other Available Help at Discharge: Family Type of Home: House Home Access: Stairs to enter Entrance Stairs-Rails: Right;Left;Can reach both Secretary/administrator of Steps: 2   Home Layout: One level Home Equipment: Cane - single point Additional Comments: Home set up is based on her daughters house here in [place]; where pt will stay next couple days. Pt lives in [place] with her husband. Their home there has 32 steps.    Prior Function Prior Level of Function : Independent/Modified Independent             Mobility  Comments: Mod indep with SPC; walks 4 mi/day a few times a week at the park ADLs Comments: Mod indep     Hand Dominance   Dominant Hand: Right    Extremity/Trunk Assessment   Upper Extremity Assessment Upper Extremity Assessment: Overall WFL for tasks assessed    Lower Extremity Assessment Lower Extremity Assessment: Overall WFL for tasks assessed    Cervical / Trunk Assessment Cervical / Trunk Assessment:  Kyphotic  Communication   Communication: No difficulties  Cognition Arousal/Alertness: Awake/alert Behavior During Therapy: WFL for tasks assessed/performed Overall Cognitive Status: Within Functional Limits for tasks assessed                                 General Comments: Pt at times tangential and off topic        General Comments General comments (skin integrity, edema, etc.): Orthostatic BP's listed above.    Exercises     Assessment/Plan    PT Assessment Patient does not need any further PT services  PT Problem List         PT Treatment Interventions      PT Goals (Current goals can be found in the Care Plan section)  Acute Rehab PT Goals PT Goal Formulation: All assessment and education complete, DC therapy    Frequency     Barriers to discharge        Co-evaluation               AM-PAC PT "6 Clicks" Mobility  Outcome Measure Help needed turning from your back to your side while in a flat bed without using bedrails?: None Help needed moving from lying on your back to sitting on the side of a flat bed without using bedrails?: None Help needed moving to and from a bed to a chair (including a wheelchair)?: None Help needed standing up from a chair using your arms (e.g., wheelchair or bedside chair)?: None Help needed to walk in hospital room?: None Help needed climbing 3-5 steps with a railing? : A Little 6 Click Score: 23    End of Session   Activity Tolerance: Patient tolerated treatment well Patient left: in bed;with call bell/phone within reach;with bed alarm set Nurse Communication: Mobility status PT Visit Diagnosis: Other abnormalities of gait and mobility (R26.89)    Time: 6219-4712 PT Time Calculation (min) (ACUTE ONLY): 21 min   Charges:   PT Evaluation $PT Eval Low Complexity: 1 Low        Ina Homes, PT, DPT Acute Rehabilitation Services  Pager [phone] Office [phone]  Malachy Chamber 12/19/2021,  9:54 AM

## 2021-12-19 NOTE — ED Notes (Signed)
Pt BIB spouse s/p syncopal episode while standing up from the dinner table. Pt a/ox4, denies dizziness, headache, CP, SOB, recent illness/fever or GU symptoms/.

## 2021-12-19 NOTE — Evaluation (Signed)
Occupational Therapy Evaluation Patient Details Name: Debra Duran MRN: [id number] DOB: 1936-03-09 Today's Date: 12/19/2021   History of Present Illness 85 y.o. F admitted on 12/24 due to a syncopal episode. PMH not on file.   Clinical Impression   Pt admitted for concerns listed above. PTA Pt reported that she was independnt with all ADL's and IADL's, including walking and driving. At this time, pt presents near her baseline with fair balance and activity tolerance. Pt reports she feels fine and just wants to go home to her daughters house. Pt was very orthostatic, however no symptoms this session. OT will sign off at this time.   Orthostatic BPs  Supine 202/94 (127)  Sitting after 3 min 144/100 (112)  Standing 116/76 (89)  Standing after 5 min 163/86 (109)         Recommendations for follow up therapy are one component of a multi-disciplinary discharge planning process, led by the attending physician.  Recommendations may be updated based on patient status, additional functional criteria and insurance authorization.   Follow Up Recommendations  No OT follow up    Assistance Recommended at Discharge PRN  Functional Status Assessment  Patient has not had a recent decline in their functional status  Equipment Recommendations  None recommended by OT    Recommendations for Other Services       Precautions / Restrictions Precautions Precautions: Fall Precaution Comments: Orthostatic BP Restrictions Weight Bearing Restrictions: No      Mobility Bed Mobility Overal bed mobility: Independent                  Transfers Overall transfer level: Modified independent Equipment used: None               General transfer comment: increased time, slow gait      Balance Overall balance assessment: No apparent balance deficits (not formally assessed)                                         ADL either performed or assessed with clinical  judgement   ADL Overall ADL's : At baseline;Independent                                       General ADL Comments: No difficulties or concerns     Vision Baseline Vision/History: 0 No visual deficits Ability to See in Adequate Light: 0 Adequate Patient Visual Report: No change from baseline Vision Assessment?: No apparent visual deficits     Perception     Praxis      Pertinent Vitals/Pain Pain Assessment: No/denies pain     Hand Dominance Right   Extremity/Trunk Assessment Upper Extremity Assessment Upper Extremity Assessment: Overall WFL for tasks assessed   Lower Extremity Assessment Lower Extremity Assessment: Overall WFL for tasks assessed   Cervical / Trunk Assessment Cervical / Trunk Assessment: Normal   Communication Communication Communication: No difficulties   Cognition Arousal/Alertness: Awake/alert Behavior During Therapy: WFL for tasks assessed/performed Overall Cognitive Status: Within Functional Limits for tasks assessed                                 General Comments: Pt at times tangential and off topic     General Comments  Orthostatic  BP's listed above.    Exercises     Shoulder Instructions      Home Living Family/patient expects to be discharged to:: Private residence Living Arrangements: Spouse/significant other Available Help at Discharge: Family Type of Home: House Home Access: Stairs to enter [employer] of Steps: 2 steps Entrance Stairs-Rails: Can reach both Home Layout: One level     Bathroom Shower/Tub: Chief Strategy Officer: Standard     Home Equipment: Gilmer Mor - single point   Additional Comments: Home set up is based on her daughters house here in [place]. Pt lives in [place] with her husband. Their home there has 32 steps.      Prior Functioning/Environment Prior Level of Function : Independent/Modified Independent             Mobility Comments: Walks 4  miles a few times a week at a park ADLs Comments: indep        OT Problem List: Decreased activity tolerance;Impaired balance (sitting and/or standing);Cardiopulmonary status limiting activity      OT Treatment/Interventions:      OT Goals(Current goals can be found in the care plan section) Acute Rehab OT Goals Patient Stated Goal: To go home OT Goal Formulation: With patient Time For Goal Achievement: 12/19/21 Potential to Achieve Goals: Good  OT Frequency:     Barriers to D/C:            Co-evaluation              AM-PAC OT "6 Clicks" Daily Activity     Outcome Measure Help from another person eating meals?: None Help from another person taking care of personal grooming?: None Help from another person toileting, which includes using toliet, bedpan, or urinal?: None Help from another person bathing (including washing, rinsing, drying)?: None Help from another person to put on and taking off regular upper body clothing?: None Help from another person to put on and taking off regular lower body clothing?: None 6 Click Score: 24   End of Session Equipment Utilized During Treatment: Gait belt Nurse Communication: Mobility status;Other (comment) (RN aware of orthostatics)  Activity Tolerance: Patient tolerated treatment well Patient left: in bed;with call bell/phone within reach  OT Visit Diagnosis: Unsteadiness on feet (R26.81);Other abnormalities of gait and mobility (R26.89)                Time: 1191-4782 OT Time Calculation (min): 28 min Charges:  OT General Charges $OT Visit: 1 Visit OT Evaluation $OT Eval Moderate Complexity: 1 Mod OT Treatments $Therapeutic Activity: 8-22 mins  Jacqulynn Shappell H., OTR/L Acute Rehabilitation  Layken Doenges Elane Normajean Nash 12/19/2021, 9:20 AM

## 2021-12-20 LAB — CBC
HCT: 35.3 % — ABNORMAL LOW (ref 36.0–46.0)
Hemoglobin: 11.6 g/dL — ABNORMAL LOW (ref 12.0–15.0)
MCH: 27.8 pg (ref 26.0–34.0)
MCHC: 32.9 g/dL (ref 30.0–36.0)
MCV: 84.4 fL (ref 80.0–100.0)
Platelets: 166 10*3/uL (ref 150–400)
RBC: 4.18 MIL/uL (ref 3.87–5.11)
RDW: 15.8 % — ABNORMAL HIGH (ref 11.5–15.5)
WBC: 3.5 10*3/uL — ABNORMAL LOW (ref 4.0–10.5)
nRBC: 0 % (ref 0.0–0.2)

## 2021-12-20 LAB — BASIC METABOLIC PANEL
Anion gap: 7 (ref 5–15)
BUN: 11 mg/dL (ref 8–23)
CO2: 27 mmol/L (ref 22–32)
Calcium: 8.9 mg/dL (ref 8.9–10.3)
Chloride: 106 mmol/L (ref 98–111)
Creatinine, Ser: 1.04 mg/dL — ABNORMAL HIGH (ref 0.44–1.00)
GFR, Estimated: 53 mL/min — ABNORMAL LOW (ref >60)
Glucose, Bld: 90 mg/dL (ref 70–99)
Potassium: 3.5 mmol/L (ref 3.5–5.1)
Sodium: 140 mmol/L (ref 135–145)

## 2021-12-20 LAB — MAGNESIUM: Magnesium: 2 mg/dL (ref 1.7–2.4)

## 2021-12-20 MED ORDER — HYDRALAZINE HCL 20 MG/ML IJ SOLN
10.0000 mg | INTRAMUSCULAR | Status: DC | PRN
Start: 2021-12-20 — End: 2021-12-20
  Administered 2021-12-20: 05:00:00 10 mg via INTRAVENOUS
  Filled 2021-12-20: qty 1

## 2021-12-20 MED ORDER — POTASSIUM CHLORIDE CRYS ER 20 MEQ PO TBCR
40.0000 meq | EXTENDED_RELEASE_TABLET | Freq: Once | ORAL | Status: AC
  Administered 2021-12-20: 11:00:00 40 meq via ORAL
  Filled 2021-12-20: qty 2

## 2021-12-20 NOTE — Discharge Summary (Signed)
Physician Discharge Summary  Debra Duran JXB:[id number] DOB: 1936/05/25 DOA: 12/18/2021  PCP: Pcp, No  Admit date: 12/18/2021 Discharge date: 12/20/2021  Admitted From: Home Disposition:  Home  Discharge Condition:Stable CODE STATUS:FULL Diet recommendation: Heart Healthy   Brief/Interim Summary: Debra Duran is a 85 y.o. female with medical history significant of coronary artery disease status post CABG, stent placement, hypertension who presented from home after she had an episode of syncope.  She had this episode in the past as well and it is observed mainly during daytime.  During this hospitalization, she was noted to be orthostatic, did not respond much to the fluids.  Most likely autonomic dysfunction.  Echocardiogram showed normal EF.  She did well with the PT, no follow-up recommended.  She is medically stable for discharge to home today.  Following problems were addressed during her hospitalization:  Syncope: This is most likely secondary to orthostatic hypotension.  Orthostatic vitals were positive on presentation.  Repeat orthostatic vitals done by PT/OT were positive , repeat vitals done today are slightly better but she still orthostatic.  She does not have any symptoms though.  She denies any dizziness or lightheadedness when her blood pressure fell and she was not unsteady. Patient had a similar event in the past. .PT/OT did not recommend any follow-up on discharge  EKG showed T wave inversions in the lateral leads most likely chronic findings.  Denies chest pain. Echo showed EF of 55 to 60%, grade 1 diastolic dysfunction. As per the husband, he has observed  syncopal episode mainly during the dinnertime in the past as well.   Might have recent orthostatic hypotension, related to autonomic dysfunction.  We advised her to wear compression stockings.  She needs to be careful while ambulating and on getting up from lying or sitting position.  Extensive discussion done  with the husband on the phone   Hypertension: Consistently hypertensive here.  Takes Imdur, amlodipine at home, restarted those.   Hypokalemia: Supplemented with potassium   HLD: On crestor   History of coronary artery disease;Status post CABG, status post stent placement.  Takes Plavix at home.  Follows with cardiology at [facility]  Discharge Diagnoses:  Principal Problem:   Syncope Active Problems:   HTN (hypertension)   CAD (coronary artery disease)    Discharge Instructions  Discharge Instructions     Diet - low sodium heart healthy   Complete by: As directed    Discharge instructions   Complete by: As directed    1)Please be careful while getting up from the bed or sitting position 2)Wear compression stockings 3)Follow up with your primary care physician and cardiologist 4)Monitor your blood pressure at home   Increase activity slowly   Complete by: As directed       Allergies as of 12/20/2021       Reactions   Aspirin Hives   Penicillins Hives        Medication List     TAKE these medications    amLODipine 2.5 MG tablet Commonly known as: NORVASC Take 2.5-5 mg by mouth See admin instructions. 2.5 mg in the afternoon 5 mg in the evening   clopidogrel 75 MG tablet Commonly known as: PLAVIX Take 75 mg by mouth every evening.   CoQ-10 100 MG Caps Take 100 mg by mouth daily. afternoon   donepezil 10 MG tablet Commonly known as: ARICEPT Take 10 mg by mouth at bedtime.   ezetimibe 10 MG tablet Commonly known as: ZETIA Take 10 mg by  mouth every evening.   isosorbide dinitrate 20 MG tablet Commonly known as: ISORDIL Take 20 mg by mouth 2 (two) times daily.   OVER THE COUNTER MEDICATION Take 1 capsule by mouth daily. Tumeric-- afternoon   PROBIOTIC DAILY PO Take 1 capsule by mouth daily.   rosuvastatin 40 MG tablet Commonly known as: CRESTOR Take 20 mg by mouth every other day.   traZODone 50 MG tablet Commonly known as: DESYREL Take 25  mg by mouth at bedtime.   Vitamin D-3 125 MCG (5000 UT) Tabs Take 5,000 Units by mouth daily. afternoon        Allergies  Allergen Reactions   Aspirin Hives   Penicillins Hives    Consultations: None   Procedures/Studies: DG Chest 2 View  Result Date: 12/18/2021 CLINICAL DATA:  Syncope EXAM: CHEST - 2 VIEW COMPARISON:  10/05/2021 FINDINGS: Post sternotomy changes. No focal opacity, pleural effusion or pneumothorax. Stable cardiomediastinal silhouette with aortic atherosclerosis. IMPRESSION: No active cardiopulmonary disease. Electronically Signed   By: Jasmine Pang M.D.   On: 12/18/2021 21:47   CT Head Wo Contrast  Result Date: 12/19/2021 CLINICAL DATA:  Syncope. EXAM: CT HEAD WITHOUT CONTRAST TECHNIQUE: Contiguous axial images were obtained from the base of the skull through the vertex without intravenous contrast. COMPARISON:  None. FINDINGS: Brain: No acute intracranial hemorrhage, midline shift or mass effect. No extra-axial fluid collection. There is diffuse cerebral atrophy. Subcortical and periventricular white matter hypodensities are present bilaterally. No hydrocephalus. 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 There is a hypodensity in the basal ganglia on the left, possible lacunar infarct of indeterminate age. Vascular: Atherosclerotic calcification of the carotid siphons and vertebral arteries. No hyperdense vessel. Skull: Normal. Negative for fracture or focal lesion. Sinuses/Orbits: A few opacities are present in the ethmoid air cells on the right. The orbits are within normal limits. Other: None. IMPRESSION: 1. No acute intracranial hemorrhage. 2. Hypodense focus in the basal ganglia on the left, possible lacunar infarct of indeterminate age. 3. 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 Comparison with older imaging studies or MRI is recommended for further characterization on  follow-up. 4. Atrophy with chronic microvascular ischemic changes. Electronically Signed   By: Thornell Sartorius M.D.   On: 12/19/2021 00:00   ECHOCARDIOGRAM COMPLETE  Result Date: 12/19/2021    ECHOCARDIOGRAM REPORT   Patient Name:   Debra Duran Date of Exam: 12/19/2021 Medical Rec #:  [id number]        Height:       66.0 in Accession #:    [id number]       Weight:       135.0 lb Date of Birth:  Dec 05, 1936       BSA:          1.692 m Patient Age:    85 years         BP:           183/88 mmHg Patient Gender: F                HR:           63 bpm. [facility] Procedure: 2D Echo, Cardiac Doppler and Color Doppler Indications:    Syncope R55  History:        Patient has no prior history of Echocardiogram examinations.  CAD, Signs/Symptoms:Syncope; Risk Factors:Hypertension.  Sonographer:    Celesta Gentile RCS Referring Phys: [id number] Amelie Caracci IMPRESSIONS  1. Left ventricular ejection fraction, by estimation, is 55 to 60%. The left ventricle has normal function. The left ventricle has no regional wall motion abnormalities. There is mild concentric left ventricular hypertrophy. Left ventricular diastolic parameters are consistent with Grade I diastolic dysfunction (impaired relaxation).  2. Right ventricular systolic function is normal. The right ventricular size is normal.  3. Left atrial size was mild to moderately dilated.  4. Right atrial size was mild to moderately dilated.  5. The mitral valve is normal in structure. Mild mitral valve regurgitation. No evidence of mitral stenosis.  6. The aortic valve is tricuspid. There is mild calcification of the aortic valve. Aortic valve regurgitation is trivial. No aortic stenosis is present.  7. The inferior vena cava is normal in size with greater than 50% respiratory variability, suggesting right atrial pressure of 3 mmHg. FINDINGS  Left Ventricle: Left ventricular ejection fraction, by estimation, is 55 to 60%. The left ventricle  has normal function. The left ventricle has no regional wall motion abnormalities. The left ventricular internal cavity size was normal in size. There is  mild concentric left ventricular hypertrophy. Left ventricular diastolic parameters are consistent with Grade I diastolic dysfunction (impaired relaxation). Right Ventricle: The right ventricular size is normal. No increase in right ventricular wall thickness. Right ventricular systolic function is normal. Left Atrium: Left atrial size was mild to moderately dilated. Right Atrium: Right atrial size was mild to moderately dilated. Pericardium: There is no evidence of pericardial effusion. Mitral Valve: The mitral valve is normal in structure. Mild mitral valve regurgitation. No evidence of mitral valve stenosis. Tricuspid Valve: The tricuspid valve is normal in structure. Tricuspid valve regurgitation is not demonstrated. No evidence of tricuspid stenosis. Aortic Valve: The aortic valve is tricuspid. There is mild calcification of the aortic valve. Aortic valve regurgitation is trivial. No aortic stenosis is present. Pulmonic Valve: The pulmonic valve was normal in structure. Pulmonic valve regurgitation is not visualized. No evidence of pulmonic stenosis. Aorta: The aortic root is normal in size and structure. Venous: The inferior vena cava is normal in size with greater than 50% respiratory variability, suggesting right atrial pressure of 3 mmHg. IAS/Shunts: No atrial level shunt detected by color flow Doppler.  LEFT VENTRICLE PLAX 2D LVIDd:         4.20 cm   Diastology LVIDs:         3.00 cm   LV e' medial:    4.68 cm/s LV PW:         1.20 cm   LV E/e' medial:  17.2 LV IVS:        1.20 cm   LV e' lateral:   7.94 cm/s LVOT diam:     2.00 cm   LV E/e' lateral: 10.1 LV SV:         63 LV SV Index:   38 LVOT Area:     3.14 cm  RIGHT VENTRICLE RV S prime:     8.81 cm/s TAPSE (M-mode): 2.2 cm LEFT ATRIUM             Index        RIGHT ATRIUM           Index LA diam:         2.30 cm 1.36 cm/m   RA Area:     17.60 cm LA Vol (A2C):   53.2  ml 31.44 ml/m  RA Volume:   48.80 ml  28.84 ml/m LA Vol (A4C):   37.2 ml 21.98 ml/m LA Biplane Vol: 45.9 ml 27.13 ml/m  AORTIC VALVE LVOT Vmax:   89.40 cm/s LVOT Vmean:  56.500 cm/s LVOT VTI:    0.202 m  AORTA Ao Root diam: 3.30 cm MITRAL VALVE MV Area (PHT): 3.21 cm     SHUNTS MV Decel Time: 236 msec     Systemic VTI:  0.20 m MV E velocity: 80.50 cm/s   Systemic Diam: 2.00 cm MV A velocity: 105.00 cm/s MV E/A ratio:  0.77 Arvilla Meres MD Electronically signed by Arvilla Meres MD Signature Date/Time: 12/19/2021/3:18:20 PM    Final       Subjective:  Patient seen and examined the bedside this morning.  Hemodynamically stable for discharge today Discharge Exam: Vitals:   12/20/21 0957 12/20/21 1000  BP: (!) 173/83 (!) 173/83  Pulse: 62 63  Resp: 15 20  Temp:    SpO2: 100% 100%   Vitals:   12/20/21 0436 12/20/21 0736 12/20/21 0957 12/20/21 1000  BP: (!) 187/88 (!) 161/75 (!) 173/83 (!) 173/83  Pulse: (!) 45 (!) 53 62 63  Resp: 15 14 15 20   Temp: (!) 97.5 F (36.4 C)     TempSrc: Oral     SpO2: 100% 100% 100% 100%  Weight:      Height:        General: Pt is alert, awake, not in acute distress Cardiovascular: RRR, S1/S2 +, no rubs, no gallops Respiratory: CTA bilaterally, no wheezing, no rhonchi Abdominal: Soft, NT, ND, bowel sounds + Extremities: no edema, no cyanosis    The results of significant diagnostics from this hospitalization (including imaging, microbiology, ancillary and laboratory) are listed below for reference.     Microbiology: Recent Results (from the past 240 hour(s))  Resp Panel by RT-PCR (Flu A&B, Covid) Nasopharyngeal Swab     Status: None   Collection Time: 12/19/21  2:35 AM   Specimen: Nasopharyngeal Swab; Nasopharyngeal(NP) swabs in vial transport medium  Result Value Ref Range Status   SARS Coronavirus 2 by RT PCR NEGATIVE NEGATIVE Final    Comment: (NOTE) SARS-CoV-2  target nucleic acids are NOT DETECTED.  The SARS-CoV-2 RNA is generally detectable in upper respiratory specimens during the acute phase of infection. The lowest concentration of SARS-CoV-2 viral copies this assay can detect is 138 copies/mL. A negative result does not preclude SARS-Cov-2 infection and should not be used as the sole basis for treatment or other patient management decisions. A negative result may occur with  improper specimen collection/handling, submission of specimen other than nasopharyngeal swab, presence of viral mutation(s) within the areas targeted by this assay, and inadequate number of viral copies(<138 copies/mL). A negative result must be combined with clinical observations, patient history, and epidemiological information. The expected result is Negative.  Fact Sheet for Patients:  12/21/21  Fact Sheet for Healthcare Providers:  BloggerCourse.com  This test is no t yet approved or cleared by the [place] FDA and  has been authorized for detection and/or diagnosis of SARS-CoV-2 by FDA under an Emergency Use Authorization (EUA). This EUA will remain  in effect (meaning this test can be used) for the duration of the COVID-19 declaration under Section 564(b)(1) of the Act, 21 U.S.C.section 360bbb-3(b)(1), unless the authorization is terminated  or revoked sooner.       Influenza A by PCR NEGATIVE NEGATIVE Final   Influenza B by PCR NEGATIVE NEGATIVE  Final    Comment: (NOTE) The Xpert Xpress SARS-CoV-2/FLU/RSV plus assay is intended as an aid in the diagnosis of influenza from Nasopharyngeal swab specimens and should not be used as a sole basis for treatment. Nasal washings and aspirates are unacceptable for Xpert Xpress SARS-CoV-2/FLU/RSV testing.  Fact Sheet for Patients: BloggerCourse.com  Fact Sheet for Healthcare  Providers: SeriousBroker[employer]it  This test is not yet approved or cleared by the [place] FDA and has been authorized for detection and/or diagnosis of SARS-CoV-2 by FDA under an Emergency Use Authorization (EUA). This EUA will remain in effect (meaning this test can be used) for the duration of the COVID-19 declaration under Section 564(b)(1) of the Act, 21 U.S.C. section 360bbb-3(b)(1), unless the authorization is terminated or revoked.  Performed at [facility], [address], [place], [place] [address]      Labs: BNP (last 3 results) No results for input(s): BNP in the last 8760 hours. Basic Metabolic Panel: Recent Labs  Lab 12/18/21 2131 12/19/21 0836 12/20/21 0448  NA 136 141 140  K 3.3* 3.4* 3.5  CL 97* 102 106  CO2 31 32 27  GLUCOSE 135* 103* 90  BUN 11 10 11   CREATININE 1.30* 1.15* 1.04*  CALCIUM 9.5 9.7 8.9  MG  --   --  2.0   Liver Function Tests: No results for input(s): AST, ALT, ALKPHOS, BILITOT, PROT, ALBUMIN in the last 168 hours. No results for input(s): LIPASE, AMYLASE in the last 168 hours. No results for input(s): AMMONIA in the last 168 hours. CBC: Recent Labs  Lab 12/18/21 2131 12/20/21 0448  WBC 4.8 3.5*  NEUTROABS 3.4  --   HGB 13.0 11.6*  HCT 39.8 35.3*  MCV 84.3 84.4  PLT 182 166   Cardiac Enzymes: No results for input(s): CKTOTAL, CKMB, CKMBINDEX, TROPONINI in the last 168 hours. BNP: Invalid input(s): POCBNP CBG: No results for input(s): GLUCAP in the last 168 hours. D-Dimer No results for input(s): DDIMER in the last 72 hours. Hgb A1c No results for input(s): HGBA1C in the last 72 hours. Lipid Profile No results for input(s): CHOL, HDL, LDLCALC, TRIG, CHOLHDL, LDLDIRECT in the last 72 hours. Thyroid function studies No results for input(s): TSH, T4TOTAL, T3FREE, THYROIDAB in the last 72 hours.  Invalid input(s): FREET3 Anemia work up No results for input(s): VITAMINB12, FOLATE, FERRITIN,  TIBC, IRON, RETICCTPCT in the last 72 hours. Urinalysis    Component Value Date/Time   COLORURINE YELLOW 12/18/2021 2337   APPEARANCEUR CLEAR 12/18/2021 2337   LABSPEC 1.010 12/18/2021 2337   PHURINE 7.0 12/18/2021 2337   GLUCOSEU NEGATIVE 12/18/2021 2337   HGBUR NEGATIVE 12/18/2021 2337   BILIRUBINUR NEGATIVE 12/18/2021 2337   KETONESUR NEGATIVE 12/18/2021 2337   PROTEINUR NEGATIVE 12/18/2021 2337   NITRITE NEGATIVE 12/18/2021 2337   LEUKOCYTESUR NEGATIVE 12/18/2021 2337   Sepsis Labs Invalid input(s): PROCALCITONIN,  WBC,  LACTICIDVEN Microbiology Recent Results (from the past 240 hour(s))  Resp Panel by RT-PCR (Flu A&B, Covid) Nasopharyngeal Swab     Status: None   Collection Time: 12/19/21  2:35 AM   Specimen: Nasopharyngeal Swab; Nasopharyngeal(NP) swabs in vial transport medium  Result Value Ref Range Status   SARS Coronavirus 2 by RT PCR NEGATIVE NEGATIVE Final    Comment: (NOTE) SARS-CoV-2 target nucleic acids are NOT DETECTED.  The SARS-CoV-2 RNA is generally detectable in upper respiratory specimens during the acute phase of infection. The lowest concentration of SARS-CoV-2 viral copies this assay can detect is 138 copies/mL.  A negative result does not preclude SARS-Cov-2 infection and should not be used as the sole basis for treatment or other patient management decisions. A negative result may occur with  improper specimen collection/handling, submission of specimen other than nasopharyngeal swab, presence of viral mutation(s) within the areas targeted by this assay, and inadequate number of viral copies(<138 copies/mL). A negative result must be combined with clinical observations, patient history, and epidemiological information. The expected result is Negative.  Fact Sheet for Patients:  BloggerCourse.com  Fact Sheet for Healthcare Providers:  [employer]it  This test is no t yet approved or cleared by  the [place] FDA and  has been authorized for detection and/or diagnosis of SARS-CoV-2 by FDA under an Emergency Use Authorization (EUA). This EUA will remain  in effect (meaning this test can be used) for the duration of the COVID-19 declaration under Section 564(b)(1) of the Act, 21 U.S.C.section 360bbb-3(b)(1), unless the authorization is terminated  or revoked sooner.       Influenza A by PCR NEGATIVE NEGATIVE Final   Influenza B by PCR NEGATIVE NEGATIVE Final    Comment: (NOTE) The Xpert Xpress SARS-CoV-2/FLU/RSV plus assay is intended as an aid in the diagnosis of influenza from Nasopharyngeal swab specimens and should not be used as a sole basis for treatment. Nasal washings and aspirates are unacceptable for Xpert Xpress SARS-CoV-2/FLU/RSV testing.  Fact Sheet for Patients: BloggerCourse.com  Fact Sheet for Healthcare Providers: [employer]ious[employer]ker.it  This test is not yet approved or cleared by the [place] FDA and has been authorized for detection and/or diagnosis of SARS-CoV-2 by FDA under an Emergency Use Authorization (EUA). This EUA will remain in effect (meaning this test can be used) for the duration of the COVID-19 declaration under Section 564(b)(1) of the Act, 21 U.S.C. section 360bbb-3(b)(1), unless the authorization is terminated or revoked.  Performed at [facility], [address], [place], [place] [address]     Please note: You were cared for by a hospitalist during your hospital stay. Once you are discharged, your primary care physician will handle any further medical issues. Please note that NO REFILLS for any discharge medications will be authorized once you are discharged, as it is imperative that you return to your primary care physician (or establish a relationship with a primary care physician if you do not have one) for your post hospital discharge needs so that they can reassess your  need for medications and monitor your lab values.    Time coordinating discharge: 40 minutes  SIGNED:   Burnadette Pop, MD  [facility] 12/20/2021, 10:21 AM Pager [phone]  If 7PM-7AM, please contact night-coverage [email or web address] Password [email or web address]

## 2021-12-20 NOTE — Progress Notes (Signed)
°   12/20/21 1000  Vitals  BP (!) 173/83  MAP (mmHg) 109  Pulse Rate 63  ECG Heart Rate 66  Resp 20  Level of Consciousness  Level of Consciousness Alert  MEWS COLOR  MEWS Score Color Green  Orthostatic Lying   BP- Lying 173/83  Pulse- Lying 63  Orthostatic Sitting  BP- Sitting 152/90  Pulse- Sitting 62  Orthostatic Standing at 0 minutes  BP- Standing at 0 minutes 112/61  Pulse- Standing at 0 minutes 88  Oxygen Therapy  SpO2 100 %  Glasgow Coma Scale  Eye Opening 4  Best Verbal Response (NON-intubated) 5  Best Motor Response 6  Glasgow Coma Scale Score 15  MEWS Score  MEWS Temp 0  MEWS Systolic 0  MEWS Pulse 0  MEWS RR 0  MEWS LOC 0  MEWS Score 0

## 2021-12-20 NOTE — Progress Notes (Signed)
Discharge instructions given to pt, waiting for husband to arrive as ride and also for further explanation of discharge instructions. Pt husband stated would arrive sometime after lunch.   Maryan Puls, RN 12.26.22 [phone]

## 2023-03-29 IMAGING — DX DG CHEST 2V
2 series · 2 of 2 positions shown · non-contrast
Comparison: 10/05/2021

CLINICAL DATA: Syncope

EXAM:
CHEST - 2 VIEW

[w chest lat]
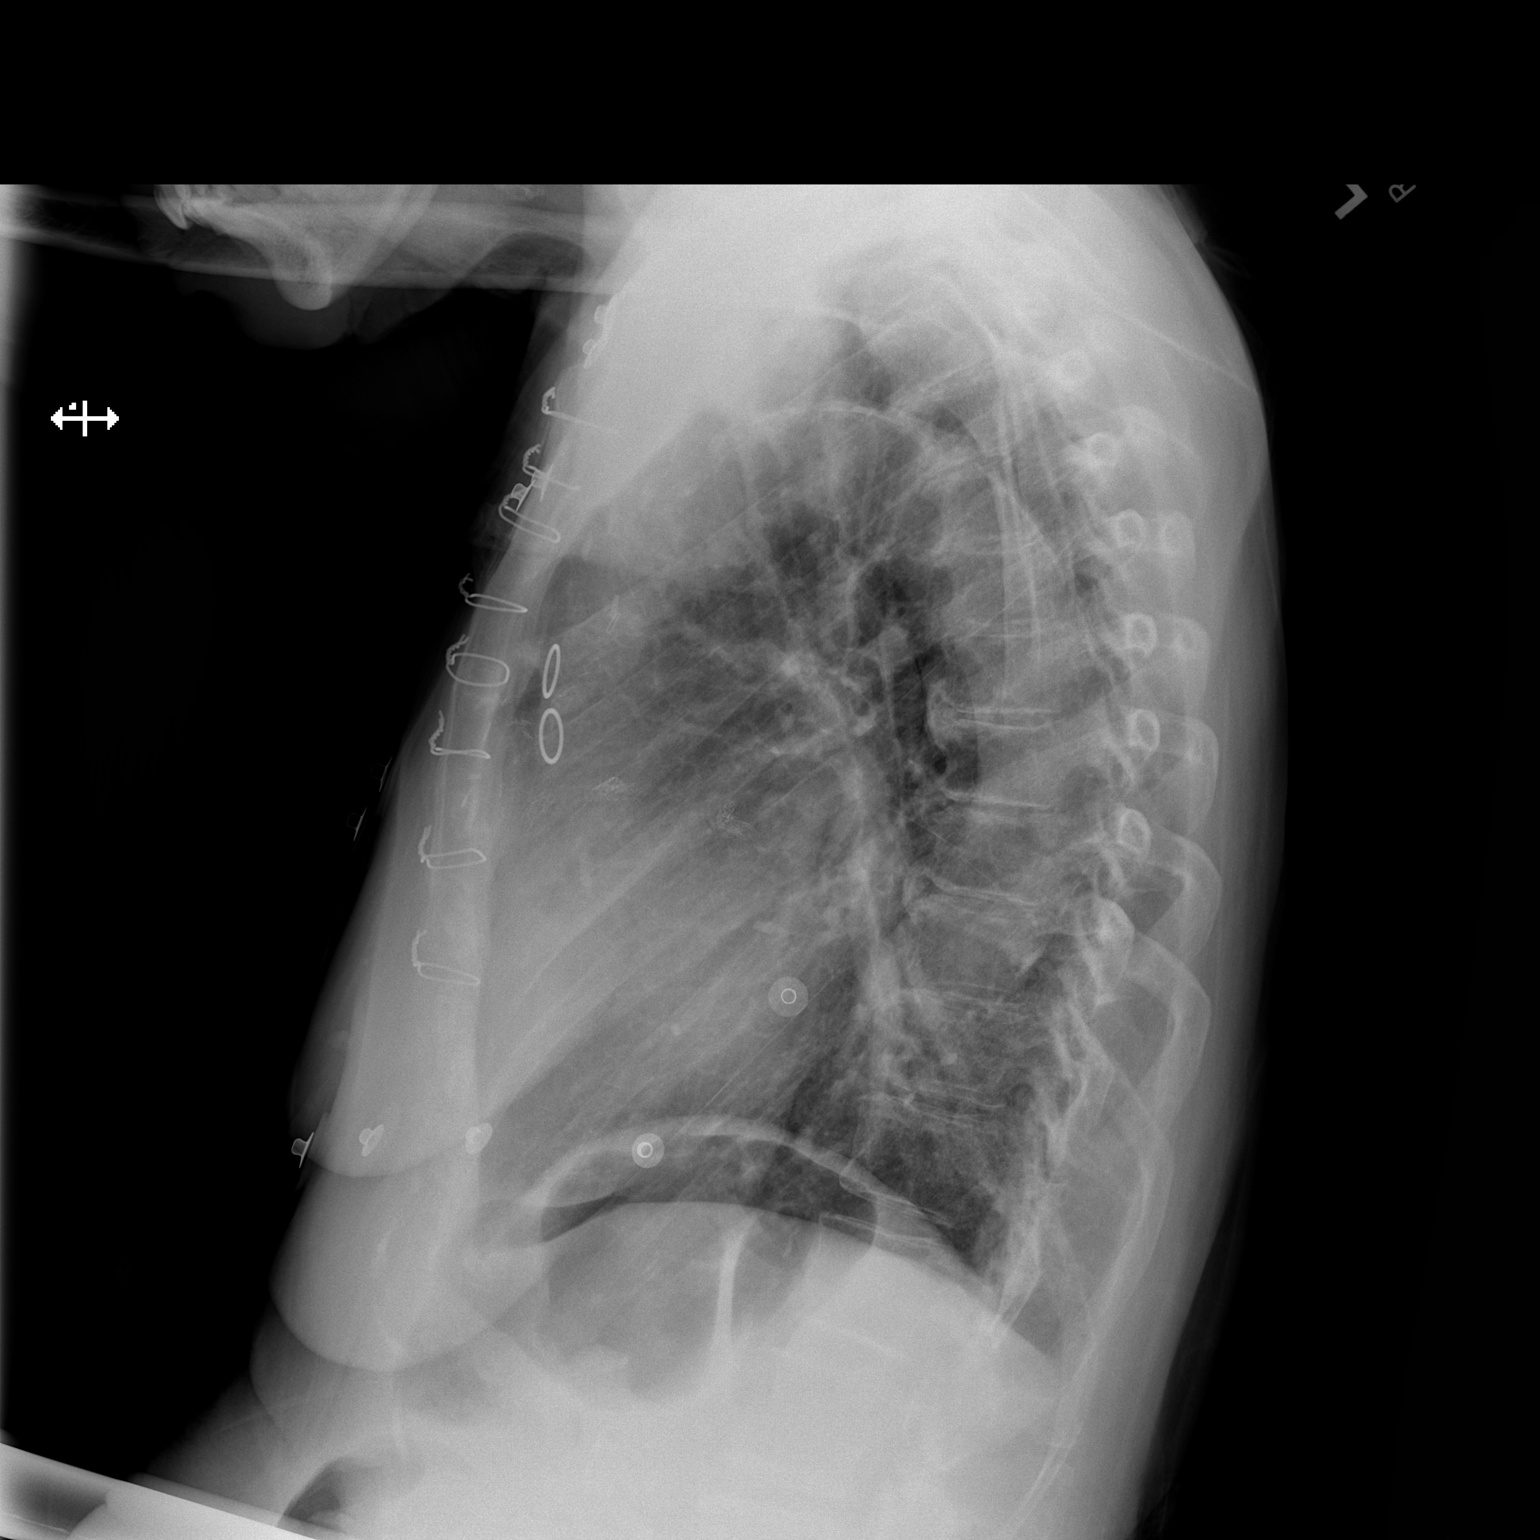

[w chest pa]
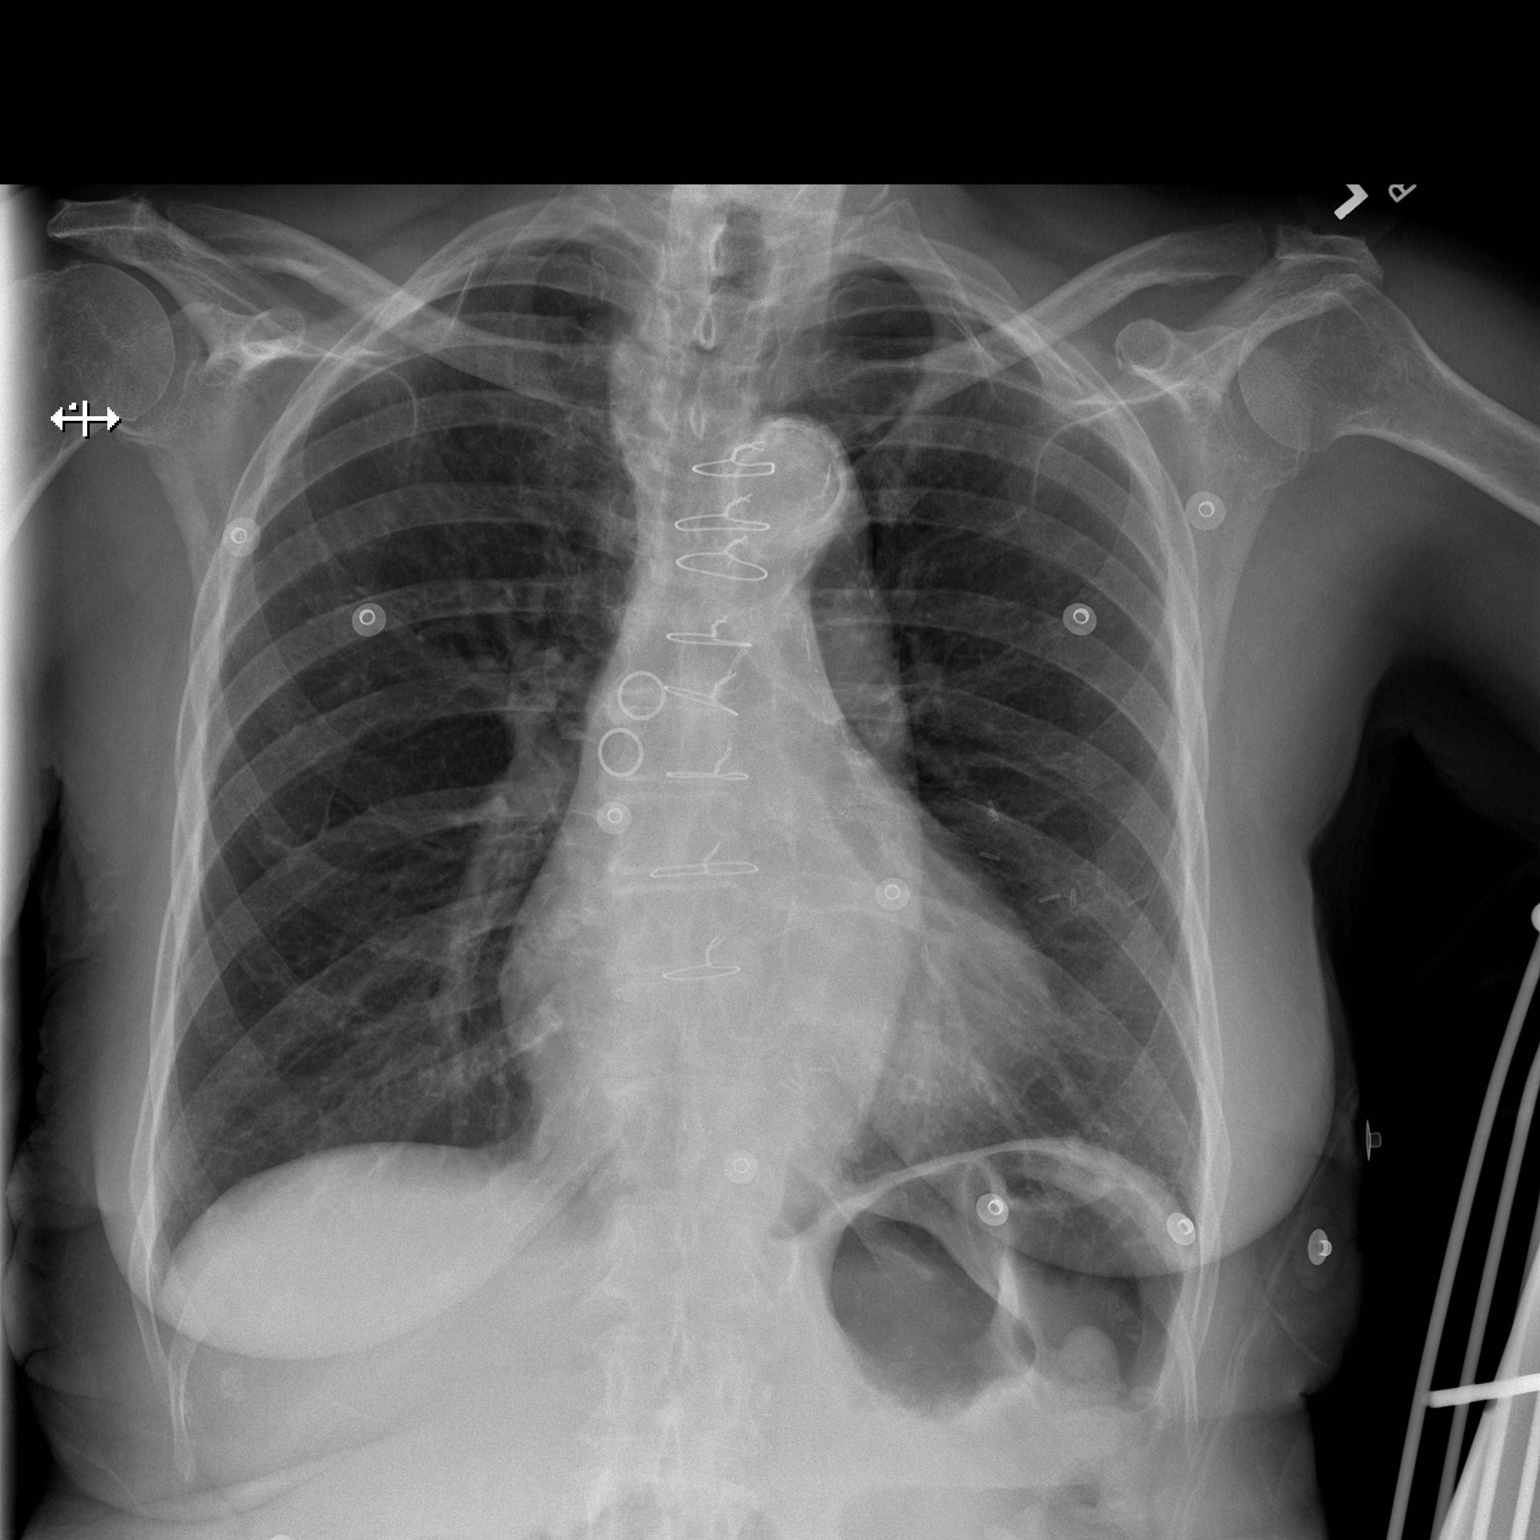

[2 of 2 positions shown; findings below may reference images not displayed]

FINDINGS: Post sternotomy changes. No focal opacity, pleural effusion or
pneumothorax. Stable cardiomediastinal silhouette with aortic
atherosclerosis.
IMPRESSION: No active cardiopulmonary disease.

## 2023-03-29 IMAGING — CT CT HEAD W/O CM
4 of 5 series · 15 of 47 positions shown, 17 images · non-contrast
Comparison: None.

CLINICAL DATA: Syncope.

EXAM:
CT HEAD WITHOUT CONTRAST
TECHNIQUE: Contiguous axial images were obtained from the base of the skull
through the vertex without intravenous contrast.

[Series 2: head wo · axial · 0.44mm/px · z∈[+987,+1092]mm · 5 of 33 slices shown, 7 images (1 of 2)]
[im 6/33  brain]
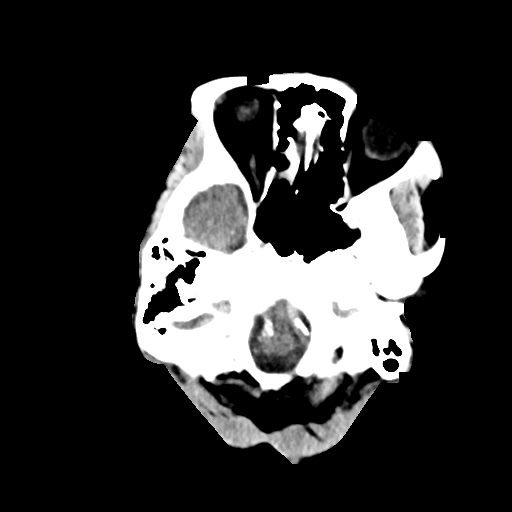
[im 6/33  bone]
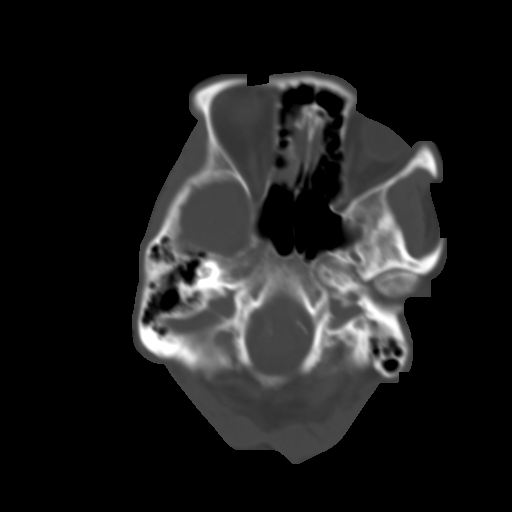
[im 11/33  brain]
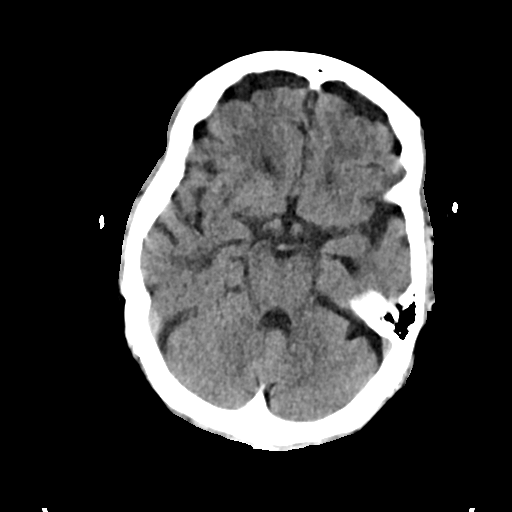
[im 17/33  brain]
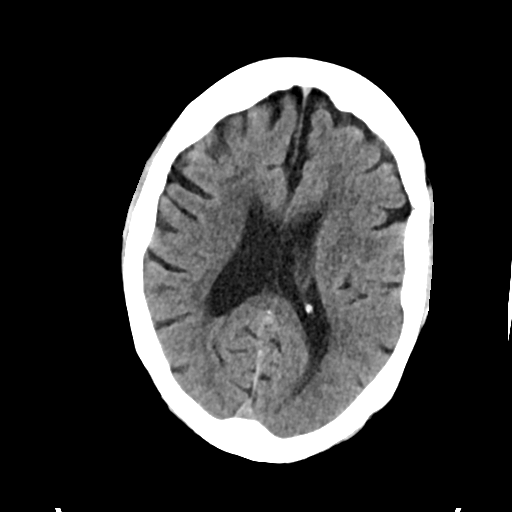
[im 22/33  brain]
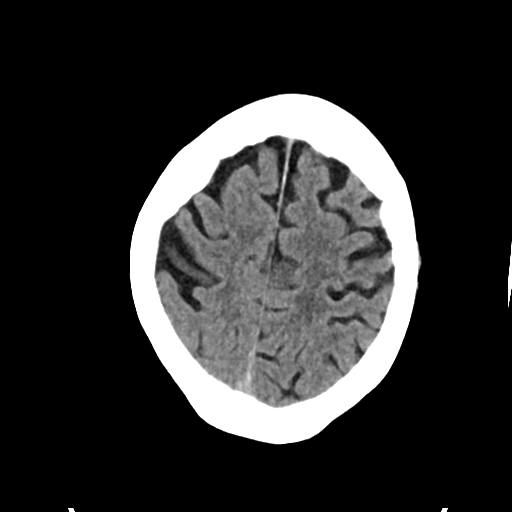
[im 27/33  brain]
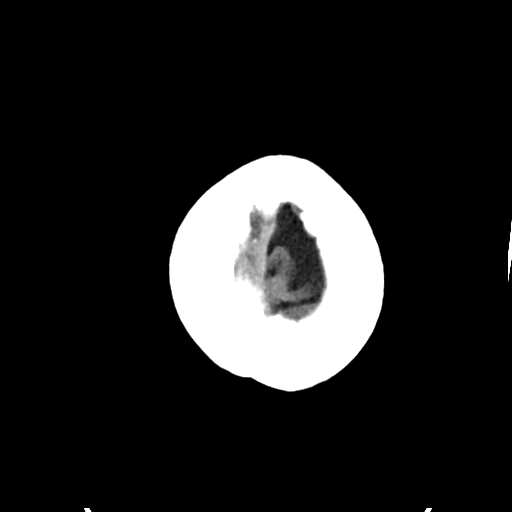
[im 27/33  bone]
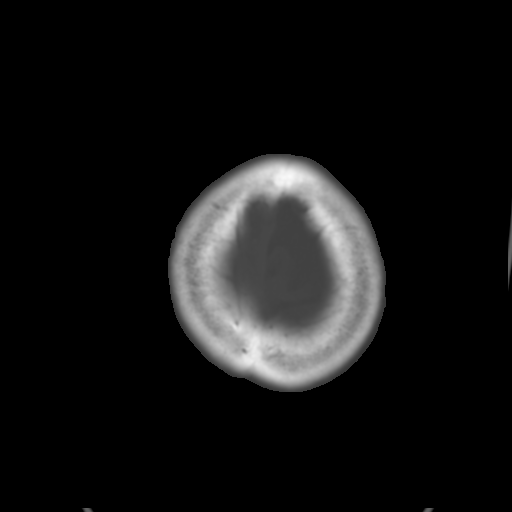

[Series 4: cor soft · coronal · 0.35mm/px · 3 of 68 slices shown]
[im 23/68  brain]
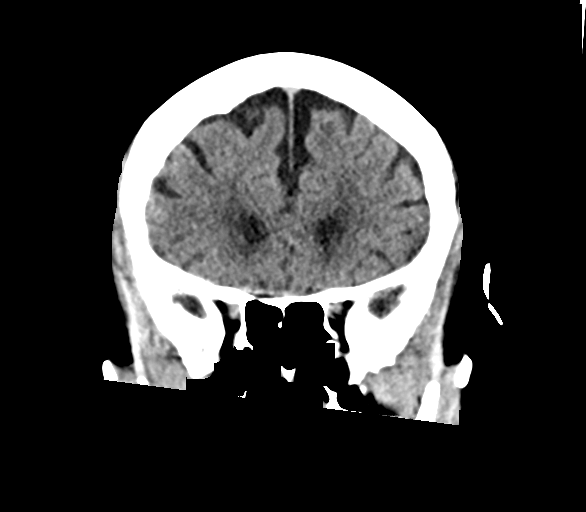
[im 30/68  brain]
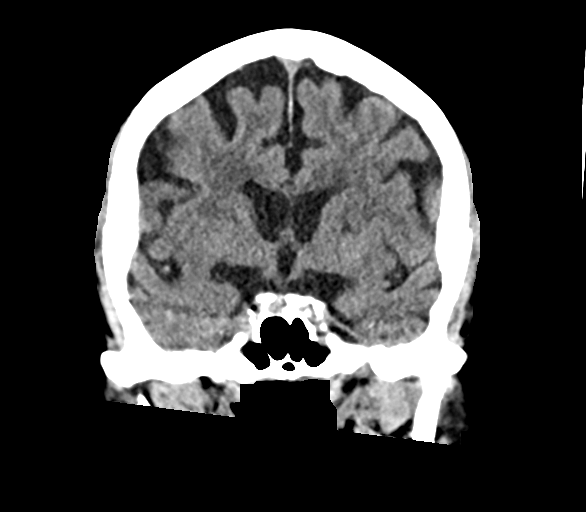
[im 38/68  brain]
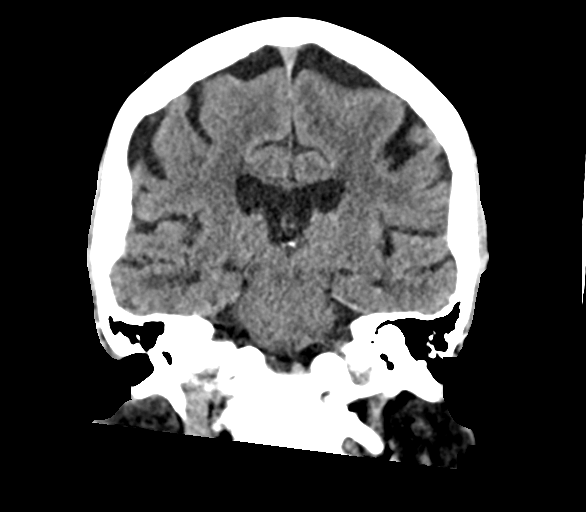

[Series 5: sag soft · sagittal · 0.35mm/px · 3 of 58 slices shown]
[im 20/58  brain]
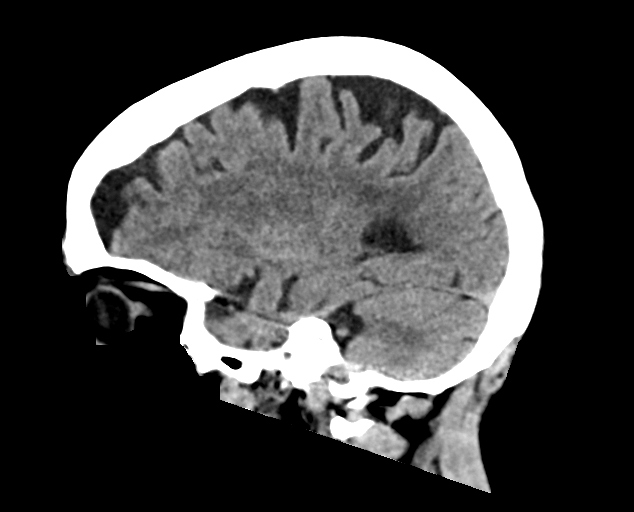
[im 29/58  brain]
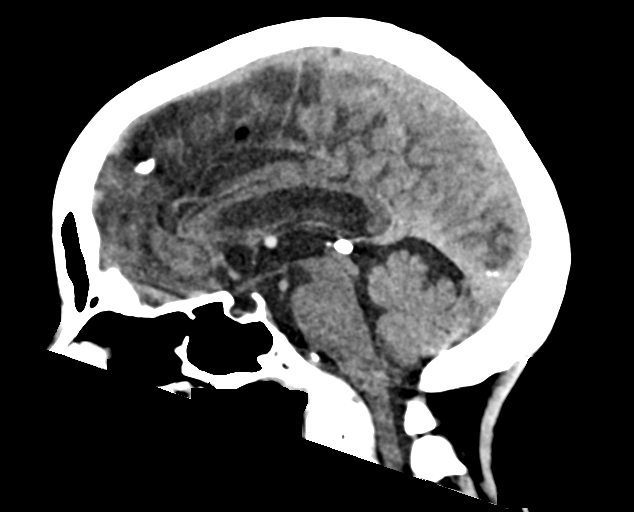
[im 38/58  brain]
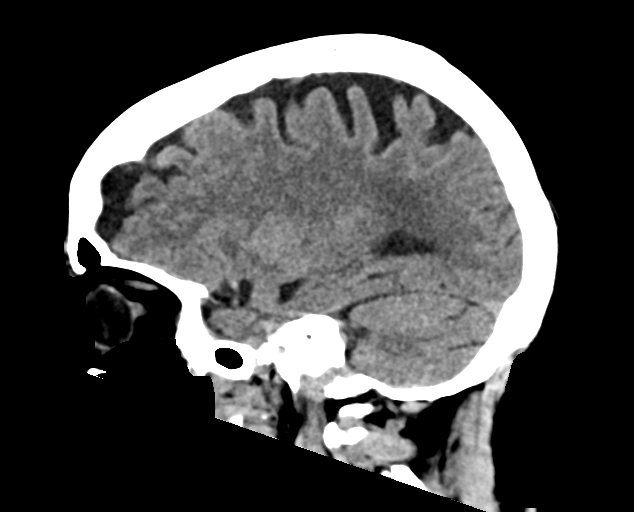

[Series 6: head wo · axial · 0.34mm/px · z∈[+923,+1007]mm · 4 of 37 slices shown (2 of 2)]
[im 7/37  brain]
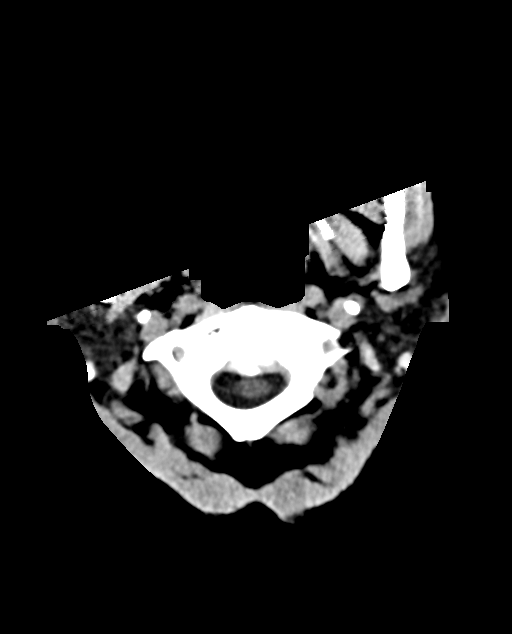
[im 13/37  brain]
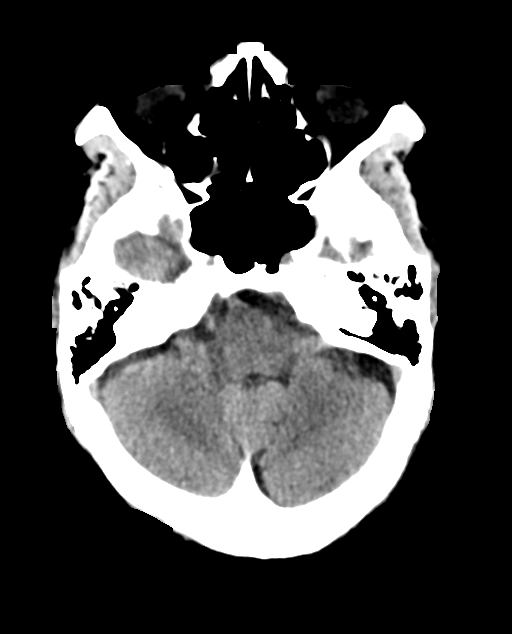
[im 19/37  brain]
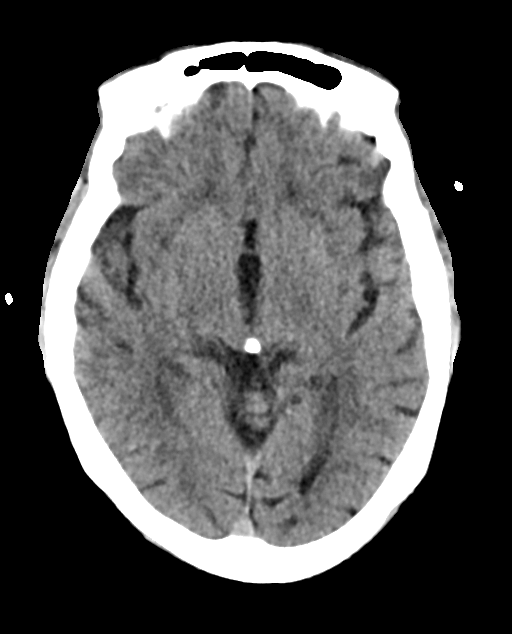
[im 25/37  brain]
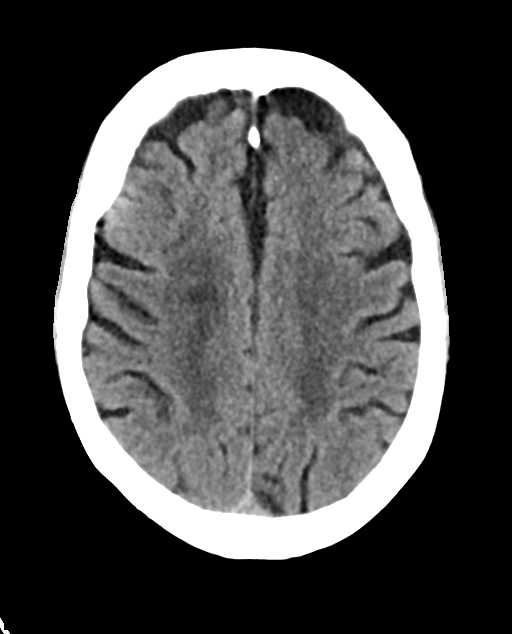

[15 of 47 positions shown; findings below may reference images not displayed]

FINDINGS: Brain: No acute intracranial hemorrhage, midline shift or mass
effect. No extra-axial fluid collection. There is diffuse cerebral
atrophy. Subcortical and periventricular white matter hypodensities
are present bilaterally. No hydrocephalus. There is a 4 mm
hyperdense structure at the roof of the third ventricle, possible
colloid cyst. There is a hypodensity in the basal ganglia on the
left, possible lacunar infarct of indeterminate age.

Vascular: Atherosclerotic calcification of the carotid siphons and
vertebral arteries. No hyperdense vessel.

Skull: Normal. Negative for fracture or focal lesion.

Sinuses/Orbits: A few opacities are present in the ethmoid air cells
on the right. The orbits are within normal limits.

Other: None.
IMPRESSION: 1. No acute intracranial hemorrhage.
2. Hypodense focus in the basal ganglia on the left, possible
lacunar infarct of indeterminate age.
3. Hyperdense focus in the roof of the third ventricle suggesting
colloid cyst. Comparison with older imaging studies or MRI is
recommended for further characterization on follow-up.
4. Atrophy with chronic microvascular ischemic changes.
